# Patient Record
Sex: Female | Born: 1980 | Race: White
Health system: Southern US, Academic
[De-identification: ages and names within clinical notes are randomized; demographics above are authoritative.]

## PROBLEM LIST (undated history)

## (undated) ENCOUNTER — Encounter

## (undated) ENCOUNTER — Telehealth

## (undated) ENCOUNTER — Ambulatory Visit: Payer: MEDICAID | Attending: MOHS-Micrographic Surgery | Primary: MOHS-Micrographic Surgery

## (undated) ENCOUNTER — Ambulatory Visit

## (undated) DIAGNOSIS — J45909 Unspecified asthma, uncomplicated: Secondary | ICD-10-CM

## (undated) HISTORY — PX: LEEP: SHX91

## (undated) HISTORY — PX: APPENDECTOMY: SHX54

---

## 2004-12-22 ENCOUNTER — Emergency Department: Payer: Self-pay | Admitting: Emergency Medicine

## 2005-03-09 ENCOUNTER — Emergency Department: Payer: Self-pay | Admitting: Emergency Medicine

## 2005-03-14 ENCOUNTER — Emergency Department: Payer: Self-pay | Admitting: Internal Medicine

## 2005-04-28 ENCOUNTER — Encounter: Payer: Self-pay | Admitting: Anesthesiology

## 2005-05-15 ENCOUNTER — Encounter: Payer: Self-pay | Admitting: Anesthesiology

## 2005-07-22 ENCOUNTER — Emergency Department: Payer: Self-pay | Admitting: Emergency Medicine

## 2005-10-02 ENCOUNTER — Emergency Department: Payer: Self-pay | Admitting: Emergency Medicine

## 2006-04-01 IMAGING — CR DG KNEE COMPLETE 4+V*L*
1 series · 4 of 4 positions shown · non-contrast
Comparison: none

REASON FOR EXAM: Fall
COMMENTS:

[Series 1: view not recorded · 0.17mm/px · 4 of 4 slices shown]
[im 1/4]
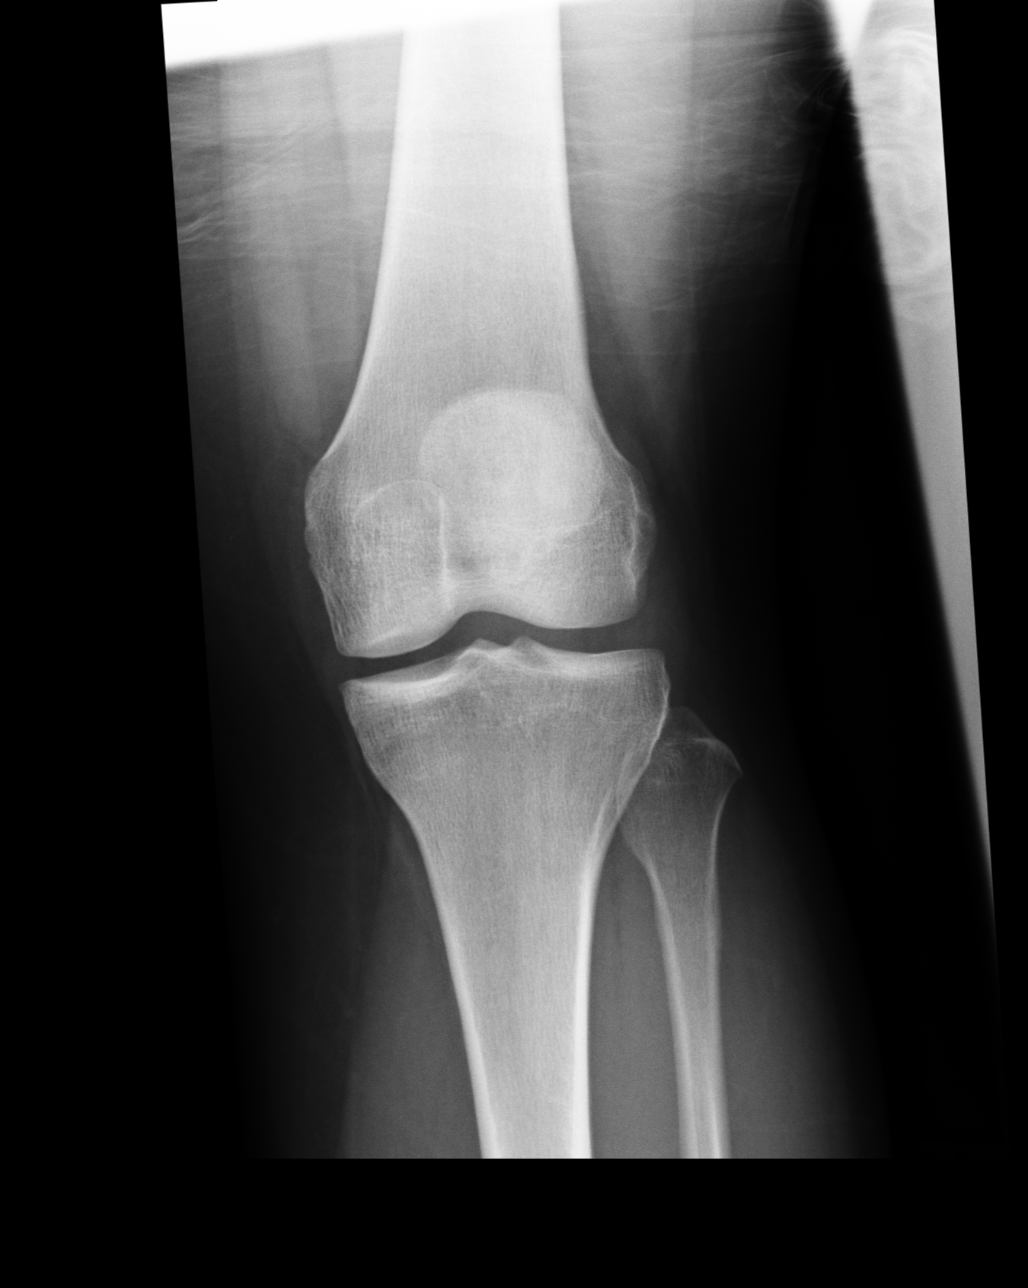
[im 2/4]
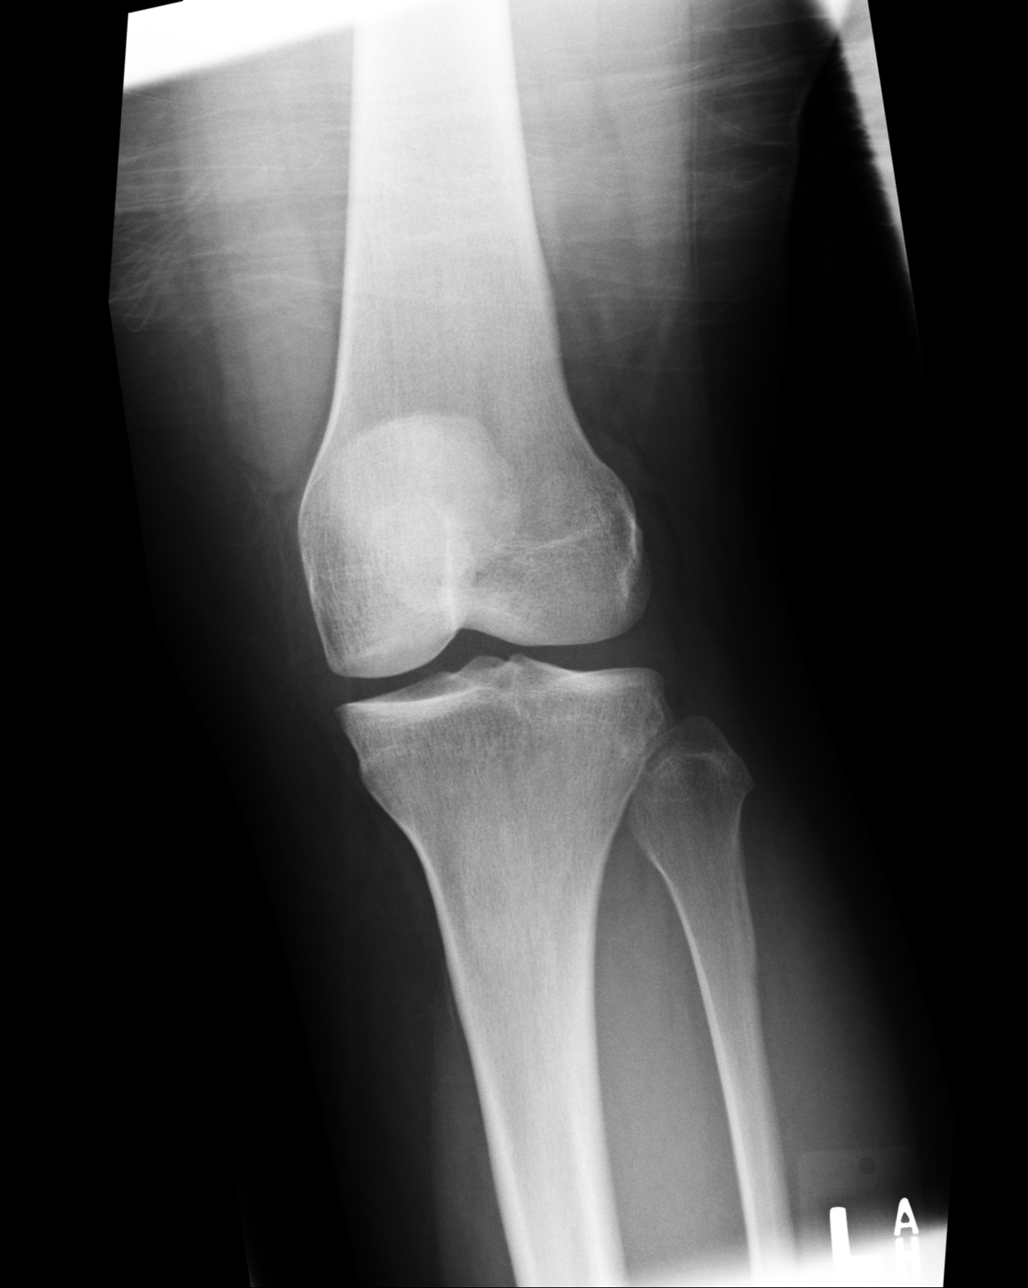
[im 3/4]
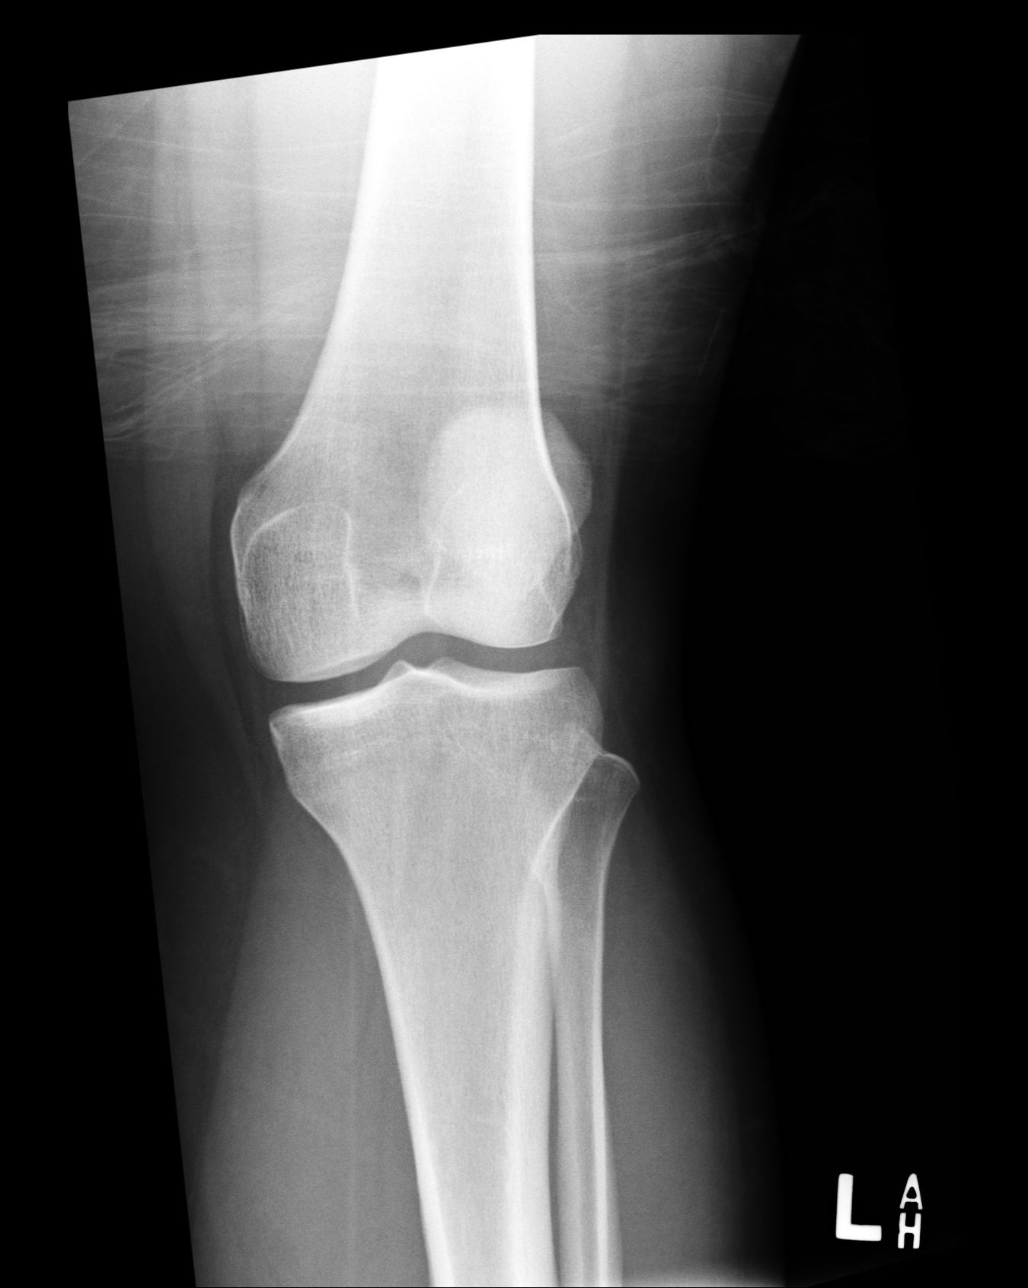
[im 4/4]
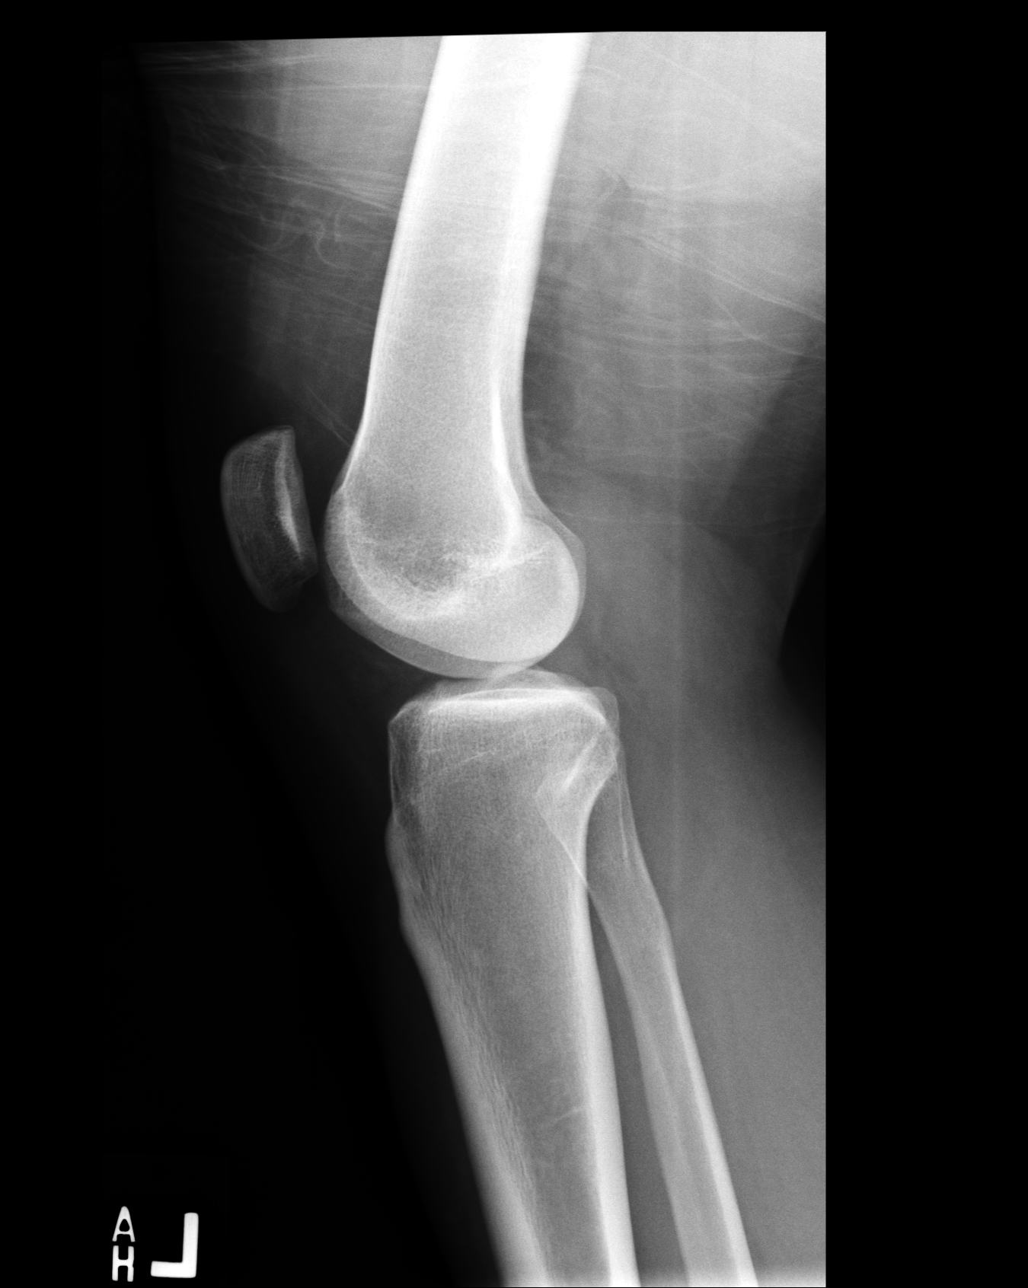

[4 of 4 positions shown; findings below may reference images not displayed]

PROCEDURE:     DXR - DXR KNEE LT COMP WITH OBLIQUES  - October 02, 2005 [DATE]

RESULT:           There does not appear to be evidence of fracture,
dislocation or malalignment.  If there is persistent clinical concern or
persistent complaints of pain, repeat evaluation in 7-10 days is recommended
if clinically warranted.
IMPRESSION: Unremarkable LEFT knee.

## 2006-04-17 ENCOUNTER — Emergency Department: Payer: Self-pay | Admitting: Unknown Physician Specialty

## 2006-07-29 ENCOUNTER — Ambulatory Visit: Payer: Self-pay | Admitting: Podiatry

## 2006-09-30 ENCOUNTER — Emergency Department: Payer: Self-pay

## 2006-12-14 ENCOUNTER — Emergency Department: Payer: Self-pay | Admitting: Emergency Medicine

## 2007-06-26 ENCOUNTER — Observation Stay: Payer: Self-pay

## 2007-07-26 ENCOUNTER — Ambulatory Visit: Payer: Self-pay | Admitting: Obstetrics & Gynecology

## 2007-07-27 ENCOUNTER — Inpatient Hospital Stay: Payer: Self-pay | Admitting: Obstetrics & Gynecology

## 2008-03-17 ENCOUNTER — Emergency Department: Payer: Self-pay | Admitting: Emergency Medicine

## 2008-03-19 ENCOUNTER — Emergency Department: Payer: Self-pay | Admitting: Unknown Physician Specialty

## 2008-04-18 ENCOUNTER — Ambulatory Visit: Payer: Self-pay | Admitting: Family Medicine

## 2008-08-02 ENCOUNTER — Emergency Department: Payer: Self-pay | Admitting: Emergency Medicine

## 2008-09-06 ENCOUNTER — Encounter
Admission: RE | Admit: 2008-09-06 | Discharge: 2008-09-06 | Payer: Self-pay | Admitting: Physical Medicine & Rehabilitation

## 2008-09-30 ENCOUNTER — Emergency Department: Payer: Self-pay | Admitting: Emergency Medicine

## 2008-11-03 ENCOUNTER — Inpatient Hospital Stay: Payer: Self-pay | Admitting: Internal Medicine

## 2008-11-18 ENCOUNTER — Inpatient Hospital Stay: Payer: Self-pay | Admitting: Internal Medicine
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
## 2011-09-09 ENCOUNTER — Emergency Department: Payer: Self-pay | Admitting: Emergency Medicine

## 2011-10-17 ENCOUNTER — Emergency Department: Payer: Self-pay | Admitting: Internal Medicine

## 2011-11-24 ENCOUNTER — Emergency Department: Payer: Self-pay | Admitting: Emergency Medicine

## 2012-04-05 ENCOUNTER — Emergency Department: Payer: Self-pay | Admitting: Emergency Medicine

## 2012-04-20 ENCOUNTER — Observation Stay: Payer: Self-pay | Admitting: Obstetrics & Gynecology

## 2012-04-20 LAB — URINALYSIS, COMPLETE
Bilirubin,UR: NEGATIVE
Ketone: NEGATIVE
Leukocyte Esterase: NEGATIVE
Nitrite: NEGATIVE
Ph: 6 (ref 4.5–8.0)
WBC UR: 2 /HPF (ref 0–5)

## 2012-04-20 LAB — FETAL FIBRONECTIN: Appearance: NORMAL

## 2012-07-20 ENCOUNTER — Ambulatory Visit: Payer: Self-pay | Admitting: Obstetrics and Gynecology

## 2012-07-20 LAB — CBC WITH DIFFERENTIAL/PLATELET
Basophil #: 0.1 10*3/uL (ref 0.0–0.1)
Basophil %: 0.5 %
Eosinophil %: 1 %
HCT: 36.6 % (ref 35.0–47.0)
HGB: 12.3 g/dL (ref 12.0–16.0)
Lymphocyte #: 2.8 10*3/uL (ref 1.0–3.6)
Lymphocyte %: 20.8 %
MCH: 29.9 pg (ref 26.0–34.0)
MCHC: 33.7 g/dL (ref 32.0–36.0)
MCV: 89 fL (ref 80–100)
Monocyte %: 5.6 %
Neutrophil #: 9.6 10*3/uL — ABNORMAL HIGH (ref 1.4–6.5)

## 2012-07-21 ENCOUNTER — Inpatient Hospital Stay: Payer: Self-pay

## 2012-07-21 LAB — DRUG SCREEN, URINE
Barbiturates, Ur Screen: NEGATIVE (ref ?–200)
Cocaine Metabolite,Ur ~~LOC~~: NEGATIVE (ref ?–300)
MDMA (Ecstasy)Ur Screen: NEGATIVE (ref ?–500)
Tricyclic, Ur Screen: NEGATIVE (ref ?–1000)

## 2012-07-22 LAB — HEMATOCRIT: HCT: 27.9 % — ABNORMAL LOW (ref 35.0–47.0)

## 2012-07-24 LAB — CBC WITH DIFFERENTIAL/PLATELET
Eosinophil #: 0.3 10*3/uL (ref 0.0–0.7)
Eosinophil %: 3.1 %
HCT: 30.6 % — ABNORMAL LOW (ref 35.0–47.0)
HGB: 10.4 g/dL — ABNORMAL LOW (ref 12.0–16.0)
Lymphocyte #: 2.2 10*3/uL (ref 1.0–3.6)
MCHC: 33.9 g/dL (ref 32.0–36.0)
MCV: 90 fL (ref 80–100)
Monocyte #: 0.7 x10 3/mm (ref 0.2–0.9)
Monocyte %: 6.3 %
Neutrophil #: 7.5 10*3/uL — ABNORMAL HIGH (ref 1.4–6.5)
Platelet: 292 10*3/uL (ref 150–440)
WBC: 10.8 10*3/uL (ref 3.6–11.0)

## 2012-07-24 LAB — BASIC METABOLIC PANEL
Calcium, Total: 8.7 mg/dL (ref 8.5–10.1)
Co2: 25 mmol/L (ref 21–32)
Creatinine: 0.82 mg/dL (ref 0.60–1.30)
EGFR (African American): >60
EGFR (Non-African Amer.): >60
Glucose: 111 mg/dL — ABNORMAL HIGH (ref 65–99)
Potassium: 3.6 mmol/L (ref 3.5–5.1)
Sodium: 138 mmol/L (ref 136–145)

## 2012-10-19 ENCOUNTER — Emergency Department: Payer: Self-pay | Admitting: Emergency Medicine

## 2012-11-19 ENCOUNTER — Ambulatory Visit: Payer: Self-pay | Admitting: Family Medicine

## 2013-02-09 ENCOUNTER — Ambulatory Visit: Payer: Self-pay | Admitting: Pain Medicine

## 2013-10-20 ENCOUNTER — Emergency Department: Payer: Self-pay | Admitting: Emergency Medicine

## 2013-10-24 LAB — WOUND CULTURE

## 2013-12-09 ENCOUNTER — Emergency Department: Payer: Self-pay | Admitting: Emergency Medicine

## 2013-12-09 LAB — URINALYSIS, COMPLETE
Bacteria: NONE SEEN
Ketone: NEGATIVE
Ph: 6 (ref 4.5–8.0)
RBC,UR: NONE SEEN /HPF (ref 0–5)
Specific Gravity: 1.004 (ref 1.003–1.030)
WBC UR: <1 /HPF (ref 0–5)

## 2013-12-09 LAB — CBC WITH DIFFERENTIAL/PLATELET
Basophil %: 0.8 %
Eosinophil #: 0.2 10*3/uL (ref 0.0–0.7)
HCT: 39 % (ref 35.0–47.0)
HGB: 12.7 g/dL (ref 12.0–16.0)
Lymphocyte #: 4.2 10*3/uL — ABNORMAL HIGH (ref 1.0–3.6)
MCH: 28.4 pg (ref 26.0–34.0)
MCHC: 32.6 g/dL (ref 32.0–36.0)
MCV: 87 fL (ref 80–100)
Monocyte %: 8 %
Neutrophil %: 49.7 %
Platelet: 315 10*3/uL (ref 150–440)
RDW: 15.1 % — ABNORMAL HIGH (ref 11.5–14.5)
WBC: 10.6 10*3/uL (ref 3.6–11.0)

## 2013-12-09 LAB — BASIC METABOLIC PANEL
Calcium, Total: 8.5 mg/dL (ref 8.5–10.1)
Chloride: 107 mmol/L (ref 98–107)
Co2: 25 mmol/L (ref 21–32)
EGFR (African American): >60
Glucose: 116 mg/dL — ABNORMAL HIGH (ref 65–99)
Potassium: 4.1 mmol/L (ref 3.5–5.1)
Sodium: 137 mmol/L (ref 136–145)

## 2014-04-18 ENCOUNTER — Emergency Department: Payer: Self-pay | Admitting: Emergency Medicine

## 2014-04-19 LAB — BASIC METABOLIC PANEL
ANION GAP: 5 — AB (ref 7–16)
BUN: 9 mg/dL (ref 7–18)
CALCIUM: 8.5 mg/dL (ref 8.5–10.1)
CHLORIDE: 108 mmol/L — AB (ref 98–107)
Co2: 25 mmol/L (ref 21–32)
Creatinine: 0.82 mg/dL (ref 0.60–1.30)
EGFR (African American): >60
GLUCOSE: 100 mg/dL — AB (ref 65–99)
Osmolality: 274 (ref 275–301)
Potassium: 3.6 mmol/L (ref 3.5–5.1)
Sodium: 138 mmol/L (ref 136–145)

## 2014-04-19 LAB — CBC
HCT: 39.2 % (ref 35.0–47.0)
HGB: 13.1 g/dL (ref 12.0–16.0)
MCH: 30.6 pg (ref 26.0–34.0)
MCHC: 33.5 g/dL (ref 32.0–36.0)
MCV: 91 fL (ref 80–100)
PLATELETS: 309 10*3/uL (ref 150–440)
RBC: 4.3 10*6/uL (ref 3.80–5.20)
RDW: 14.9 % — ABNORMAL HIGH (ref 11.5–14.5)
WBC: 10.8 10*3/uL (ref 3.6–11.0)

## 2014-04-19 LAB — SEDIMENTATION RATE: Erythrocyte Sed Rate: 5 mm/hr (ref 0–20)

## 2014-11-22 ENCOUNTER — Emergency Department: Payer: Self-pay | Admitting: Emergency Medicine

## 2015-04-03 NOTE — Consult Note (Signed)
    Maternal Age 34    Gravida 5    Para 2    Term Deliveries 2    Preterm Deliveries 0    Abortions 2    Living Children 0    Final EDD (dd-[id number]) 29-Jul-2012    Blood Type (Maternal) A negative    Antibody Screen Results (Maternal) negative    HIV Results (Maternal) negative    Gonorrhea Results (Maternal) negative    Chlamydia Results (Maternal) negative    Hepatitis C Culture (Maternal) negative    VDRL/RPR/Syphilis Results (Maternal) negative    Varicella Titer Results (Maternal) Positive    Rubella Results (Maternal) immune    Hepatitis B Surface Antigen Results (Maternal) negative    Group B Strep Results Maternal (Result >5wks must be treated as unknown) positive     Additional Comments I was asked by Dr. Jean RosenthalJackson and Dr Janene HarveyKlett to speak about neonatal abstinense syndrome with Lelan Ponsevon Walberg a 34y/o mother at 3739 weeks who has been on chronic opiates for foot pain.  I have spoken with the medical team and reviewed the chart.  She is scheduled for repeat c-section in AM, and denies any complications with her pregnancy.  Ranae states that she has been taking 1-3 percocet ~5x/week for foot pain.  She also stated that since she stopped Klonipin, she has had to use cannibus for concerns with her nerves.  I discussed the importance of pain control for both mother and child, but also discussed the risk of opiate exposure both short and long term to her child.  I stated that long term opiates may effect learning and behavior.  In the short term, infants born exposed to opiates can go through life threatening withdrawal.  Due to this concern, her sone will have to [facility], and if pharmacologic interventions are needed, [facility] may been to [facility] several weeks until he can can be wenaed off of medication.  Mom express sadness, but understanding.  As she is scheduled for a c-section tomorrow, I informed her that I will be there for her  delivery, as is routine for c-sections.  I reviewed visitation policy, and answered her questions.  Thank you for allowing us to participate in her can, and please let us know if we can be of any additional service.  I spent 45 minutes on this consultation with 30 minutes face-to-face time.    Parental Contact Parents informed at length regarding prenatal care and plan   Electronic Signatures: Torrie MayersHorowitz, Ieshia Hatcher N (MD)  (Signed 06-Aug-13 15:38)  Authored: PREGNANCY and LABOR, ADDITIONAL COMMENTS   Last Updated: 06-Aug-13 15:38 by Torrie MayersHorowitz, Yadira Hada N (MD)

## 2015-04-03 NOTE — Op Note (Signed)
PATIENT NAME:  Cassandra Cortez, Cassandra Cortez MR#:  [id number] DATE OF BIRTH:  [birth year]  DATE OF PROCEDURE:  07/21/2012  PREOPERATIVE DIAGNOSES:  1. Intrauterine pregnancy at [redacted] weeks gestation.  2. History of previous cesarean section, desires repeat.  3. Multiparity, desires permanent sterilization.   POSTOPERATIVE DIAGNOSES:  1. Intrauterine pregnancy at [redacted] weeks gestation.  2. History of previous cesarean section, desires repeat.  3. Multiparity, desires permanent sterilization.   PROCEDURES PERFORMED: 1. Low transverse cesarean section via Pfannenstiel incision.  2. Bilateral tubal ligation.   SURGEON: Thomasene MohairStephen Jackson, M.D.   ANESTHESIA: Spinal.  ESTIMATED BLOOD LOSS: 1000 mL.  OPERATIVE FLUIDS: 2700 mL crystalloid.   COMPLICATIONS: None.   FINDINGS: Gravid uterus with normal appearing fallopian tubes and ovaries.   SPECIMENS: Portion of right and left tubes to pathology for permanent.   CONDITION AT END OF PROCEDURE: Stable.  INDICATIONS FOR PROCEDURE: Ms. Laveda AbbeCrouse is Cortez 34 year old gravida 5, para 2-0-2-2 at 7839 weeks gestational age who has Cortez history of prior cesarean section. She desires repeat cesarean section and desires tubal ligation. The patient had signed the sterilization consent form at the proper time. Prior to the surgery, her desire to have Cortez tubal ligation was confirmed with the knowledge that there is Cortez risk of failure of approximately 4 in 1000 and if she does become pregnant after having Cortez tubal ligation her risk of ectopic is increased. With this knowledge in mind, she still agreed to continue with the surgery and have Cortez tubal ligation.   PROCEDURE IN DETAIL: The patient was taken to the Operating Room where spinal anesthesia was administered and was found to be adequate. She was placed in the dorsal supine position with Cortez leftward tilt. Cortez Pfannenstiel incision was made and carried through the various layers until the peritoneum was identified and entered sharply. The  peritoneum was extended in the cranial and caudal directions. Cortez bladder flap was created and Cortez bladder retractor placed to pull the bladder out of the operative area of interest. Cortez low transverse hysterotomy was made and extended laterally with cranial and caudal tension. The fetal vertex was grasped and elevated to the hysterotomy and with fundal pressure the head followed by shoulders and the rest of the body were delivered without difficulty. There was one nuchal cord noted at the time of delivery, which was delivered through. The cord was clamped and cut and the infant was handed to the awaiting pediatrician. The placenta was removed from the uterus and the uterus was exteriorized and cleared of all clots and debris. The hysterotomy was closed using Cortez #0 Vicryl in Cortez running locked fashion. Several figure-of-eight stitches were needed to obtain hemostasis along the hysterotomy.   Attention was turned to the left fallopian tube where the mid isthmic portion was grasped with Babcock clamps and suture ligated for approximately Cortez 3 cm segment using plain gut suture and Cortez segment of ligated tube was removed. The same procedure was carried out on the right side with no difficulty. Hemostasis was guaranteed on both sides.   The uterus was returned to the abdomen. The gutters were cleared of all clots and debris. The fascia was reapproximated using PDS, using two different stitches, tied in the middle. The subcutaneous adipose tissue was closed using Cortez 3-0 on Cortez SH needle. The skin was reapproximated using staples.   At the end of the procedure, Cortez KCI wound VAC was placed across the incision according to the manufacturer's instructions.   The patient tolerated  the procedure well. Sponge, lap, and needle counts were correct x2. For antibiotic prophylaxis, the patient received 3 grams of Ancef prior to skin incision. For VTE prophylaxis, the patient was wearing pneumatic compression stockings throughout the entire  procedure. She was transferred to the recovery area in stable condition.  ____________________________ Conard Novak, MD sdj:slb D: 07/21/2012 09:38:22 ET T: 07/21/2012 11:01:32 ET JOB#: [id number]  cc: Conard Novak, MD, <Dictator> Conard Novak MD ELECTRONICALLY SIGNED 07/22/2012 9:21

## 2015-04-07 NOTE — Consult Note (Signed)
PATIENT NAME:  Cassandra Cortez, Cassandra Cortez MR#:  [id number] DATE OF BIRTH:  1981-03-18  DATE OF CONSULTATION:  04/19/2014  REFERRING PHYSICIAN:  Dr. Chiquita Loth CONSULTING PHYSICIAN:  Susa Griffins, MD  PRIMARY CARE PHYSICIAN: Dr. Temple Pacini  REASON FOR CONSULT: Left-sided facial pain.   HISTORY OF PRESENT ILLNESS:  The patient is a 34 year old morbidly obese female with a BMI of 40 who comes to the Emergency Department with complaints of left-sided facial pain. The patient states the pain started since yesterday, sharp in nature, intermittent lasts for a few seconds to 1 hour . Nothing relieves the pain. No radiation. The patient states this is an electric shock-like pain. Denies having any recent cold or cough. Denies having any earache. Denies having any decreased hearing. Denies having any drainage from the nose, ears, or throat. Denies having any trauma.  Denies having any fever. Came to the emergency department. The patient received multiple doses of pain medication and continues to state that the patient is having the pain.  As I entered the room, patient was sleeping, was able to sit down and able to speak comfortably. During the conversation, the patient stated that developed another attack; however, patient was touching her face. The patient denied having any change in her vision.   PAST MEDICAL HISTORY: Bone spurs in the foot. Asthma.   PAST SURGICAL HISTORY:  1. A LEEP procedure.  2. Ear tubes x 8 to the right ear. 3. Appendectomy.  4. C-section.   ALLERGIES: ASPIRIN, PENICILLIN, AND SULFA.   HOME MEDICATIONS:  1. Ventolin 1 puff once a day.  2. Pulmicort 1 puff once a day.  3. Neurontin 300 mg once a day.   SOCIAL HISTORY:  Denies smoking, drinking alcohol or using illicit drugs.  Lives with her parents. Does not work.   FAMILY HISTORY: Hypertension.   REVIEW OF SYSTEMS: CONSTITUTIONAL:  Denies having any fever.  EYES: No change in vision.  EARS, NOSE, AND THROAT: No change in  hearing.  RESPIRATORY: No cough, shortness of breath.  CARDIOVASCULAR: No chest pain, palpations. GASTROINTESTINAL: No nausea, vomiting, abdominal pain.  GENITOURINARY: No dysuria or hematochezia.   HEMATOLOGY:  No easy bruising or bleeding.  SKIN: No rash or lesions.  MUSCULOSKELETAL: No joint pains and aches . NEUROLOGICAL:  No weakness or numbness in any part of the body.   PHYSICAL EXAMINATION:  GENERAL: This is a well built, well nourished, morbidly obese female sitting in the bed, not in distress.  VITAL SIGNS: Temperature 97.7, pulse 80, blood pressure 118/82, respiratory rate of 16, oxygen saturation is 97% on room air.  HEENT: Head normocephalic, atraumatic. There is no scleral icterus. Conjunctivae normal. Pupils equal and react to light. Extraocular movements are intact. Mucous membranes moist. No pharyngeal erythema. On examination of the left ear, the patient did not have any tenderness. Moving the external ear.  External ear canal . Tympanic membranes: No bulging, no effusion. Right ear shows chronic post dilation.  NECK: Supple. No lymphadenopathy. No JVD. No carotid bruits.   CHEST: Has no focal tenderness.  Lungs bilaterally clear to auscultation. HEART: S1, S2, regular. No murmurs are heard.  ABDOMEN: Bowel sounds present. Soft, nontender, nondistended. No hepatosplenomegaly.  EXTREMITIES: No pedal edema. Pulses 2+. NEUROLOGICAL:  Patient is alert, but oriented to place, person, and time. Cranial nerves II through XII intact. Motor 5/5 in upper and lower extremities.  SKIN: No rash or lesions.  MUSCULOSKELETAL: No joint pains and aches.   LABORATORY DATA: CBC  is completely within normal limits. A CMP is completely within normal limits. Sedimentation rate of 5.   RADIOLOGICAL DATA:  A CT head without contrast: No acute intracranial abnormality.   ASSESSMENT AND PLAN: This patient is a 34 year old female who comes to the Emergency Department with complaints of left-sided  facial pain.  Differential diagnosis, possible acute viral neuritis versus the possibility of patient developing shingles.  However, the patient does not have any symptoms of pain eliciting through touch, which seems to be very unlikely that this is a trigeminal neuralgia.  Also, the other possibility of left ear infection.  At present, patient does not have any bulging of the tympanic membranes. No redness.  Patient does not have any fever. This pain of acute onset of one day's duration. The patient not being in distress .  Recommended patient being on nonsteroidal anti-inflammatory drug 3 times a day for 3 days while continuing her Neurontin. Discussed with Dr. Dolores FrameSung. Agreeable with this approach.    ____________________________ Susa GriffinsPadmaja Mylan Schwarz, MD pv:dd/am D: 04/19/2014 04:10:35 ET T: 04/19/2014 04:34:55 ET JOB#: [id number]  cc: Susa GriffinsPadmaja Curley Fayette, MD, <Dictator> Clerance LavPADMAJA Jasiah Elsen MD ELECTRONICALLY SIGNED 04/26/2014 21:01

## 2015-04-24 NOTE — H&P (Signed)
L&D Evaluation:  History Expanded:   HPI 34 yo [id number] WF at 25 5/7 weeks w Intermittant, Severe at times, Lower Abd Pain, No Radiation, No assoc symptoms, No modifiers, No context.  Normal BMs.  No recent UTI.  No n/v/f/c.  DFM today but ususally fetus moves well.  No vaginal bleeding, ROM, Ctxs. Prenatal Care at [facility]. Plans Cesarean Section and Bilateral Tubal Ligation at term.    Gravida 5    Term 2    PreTerm 0    Abortion 2    Living 2    Patient's Medical History Asthma  Anxiety    Patient's Surgical History Appendectomy  LEEP  Previous C-Section  ([facility], [facility]), Bunionectomy, Ear tubes.    Medications Pre Natal Vitamins    Allergies PCN, Sulfa    Social History tobacco  drugs  Narcotic and benzodiazepams abuse    Family History Non-Contributory   ROS:   General normal    HEENT normal    CNS normal    GI normal    GU normal    Resp normal    CV normal    Renal normal    MS normal   Exam:   Vital Signs stable    General no apparent distress    Mental Status clear    Chest clear    Heart normal sinus rhythm    Abdomen gravid, non-tender, Scar well healed.  Tenderness in lower abd but non-specific or localized.    Estimated Fetal Weight Average for gestational age    Back no CVAT    Edema no edema    Pelvic no external lesions, cervix closed and thick    Mebranes Intact    FHT normal rate with no decels    Ucx absent    Skin dry   Plan:   Plan Abd pain.  DFM today.  Prior Cesarean Section x2, possible Adhesions.    Comments Pain may be from adhesions. Dx of exclusion. Will check for Preterm Labor... fetal fibronectin and exam (neg). Will check for UTI/Pyelo... unlikely, UA pending. Will monitor bowel functioning... recent BM normal. No signs of pancreatitis, gastroenteritis, diverticulitis, etc. [facility] exam findings so far.  May need reassurance and rest.   Electronic Signatures: Letitia LibraHarris, Kaylum Shrum Paul (MD)   (Signed 07-May-13 15:58)  Authored: L&D Evaluation   Last Updated: 07-May-13 15:58 by Letitia LibraHarris, Percy Comp Paul (MD)

## 2015-06-07 ENCOUNTER — Emergency Department: Payer: Medicaid Other

## 2015-06-07 ENCOUNTER — Emergency Department
Admission: EM | Admit: 2015-06-07 | Discharge: 2015-06-08 | Disposition: A | Discharge status: Home / Self Care | Payer: Medicaid Other | Attending: Emergency Medicine | Admitting: Emergency Medicine

## 2015-06-07 ENCOUNTER — Encounter: Payer: Self-pay | Admitting: Emergency Medicine

## 2015-06-07 DIAGNOSIS — Z880 Allergy status to penicillin: Secondary | ICD-10-CM | POA: Insufficient documentation

## 2015-06-07 DIAGNOSIS — Z3202 Encounter for pregnancy test, result negative: Secondary | ICD-10-CM | POA: Insufficient documentation

## 2015-06-07 DIAGNOSIS — N939 Abnormal uterine and vaginal bleeding, unspecified: Secondary | ICD-10-CM | POA: Diagnosis present

## 2015-06-07 DIAGNOSIS — N938 Other specified abnormal uterine and vaginal bleeding: Secondary | ICD-10-CM

## 2015-06-07 DIAGNOSIS — R102 Pelvic and perineal pain: Secondary | ICD-10-CM

## 2015-06-07 LAB — URINALYSIS COMPLETE WITH MICROSCOPIC (ARMC ONLY)
BACTERIA UA: NONE SEEN
BILIRUBIN URINE: NEGATIVE
GLUCOSE, UA: NEGATIVE mg/dL
Ketones, ur: NEGATIVE mg/dL
Nitrite: NEGATIVE
Protein, ur: 100 mg/dL — AB
Specific Gravity, Urine: 1.023 (ref 1.005–1.030)
pH: 5 (ref 5.0–8.0)

## 2015-06-07 LAB — POCT PREGNANCY, URINE: Preg Test, Ur: NEGATIVE

## 2015-06-07 LAB — COMPREHENSIVE METABOLIC PANEL
ALBUMIN: 4.1 g/dL (ref 3.5–5.0)
ALT: 26 U/L (ref 14–54)
AST: 25 U/L (ref 15–41)
Alkaline Phosphatase: 32 U/L — ABNORMAL LOW (ref 38–126)
Anion gap: 6 (ref 5–15)
BUN: 8 mg/dL (ref 6–20)
CHLORIDE: 102 mmol/L (ref 101–111)
CO2: 26 mmol/L (ref 22–32)
CREATININE: 0.74 mg/dL (ref 0.44–1.00)
Calcium: 8.6 mg/dL — ABNORMAL LOW (ref 8.9–10.3)
GFR calc Af Amer: >60 mL/min (ref >60)
GFR calc non Af Amer: >60 mL/min (ref >60)
Glucose, Bld: 103 mg/dL — ABNORMAL HIGH (ref 65–99)
Potassium: 4 mmol/L (ref 3.5–5.1)
Sodium: 134 mmol/L — ABNORMAL LOW (ref 135–145)
Total Bilirubin: 0.2 mg/dL — ABNORMAL LOW (ref 0.3–1.2)
Total Protein: 8.1 g/dL (ref 6.5–8.1)

## 2015-06-07 LAB — CBC WITH DIFFERENTIAL/PLATELET
Basophils Absolute: 0.1 10*3/uL (ref 0–0.1)
Basophils Relative: 1 %
Eosinophils Absolute: 0.2 10*3/uL (ref 0–0.7)
Eosinophils Relative: 2 %
HCT: 42.1 % (ref 35.0–47.0)
HEMOGLOBIN: 13.8 g/dL (ref 12.0–16.0)
LYMPHS ABS: 3 10*3/uL (ref 1.0–3.6)
Lymphocytes Relative: 30 %
MCH: 28.8 pg (ref 26.0–34.0)
MCHC: 32.9 g/dL (ref 32.0–36.0)
MCV: 87.5 fL (ref 80.0–100.0)
Monocytes Absolute: 0.6 10*3/uL (ref 0.2–0.9)
Monocytes Relative: 6 %
NEUTROS ABS: 6 10*3/uL (ref 1.4–6.5)
NEUTROS PCT: 61 %
Platelets: 366 10*3/uL (ref 150–440)
RBC: 4.81 MIL/uL (ref 3.80–5.20)
RDW: 16.5 % — ABNORMAL HIGH (ref 11.5–14.5)
WBC: 9.9 10*3/uL (ref 3.6–11.0)

## 2015-06-07 LAB — WET PREP, GENITAL
TRICH WET PREP: NONE SEEN
YEAST WET PREP: NONE SEEN

## 2015-06-07 LAB — CBC
HEMATOCRIT: 39.6 % (ref 35.0–47.0)
HEMOGLOBIN: 12.8 g/dL (ref 12.0–16.0)
MCH: 28.3 pg (ref 26.0–34.0)
MCHC: 32.2 g/dL (ref 32.0–36.0)
MCV: 87.9 fL (ref 80.0–100.0)
Platelets: 333 10*3/uL (ref 150–440)
RBC: 4.51 MIL/uL (ref 3.80–5.20)
RDW: 16.6 % — ABNORMAL HIGH (ref 11.5–14.5)
WBC: 19.8 10*3/uL — ABNORMAL HIGH (ref 3.6–11.0)

## 2015-06-07 MED ORDER — OXYCODONE-ACETAMINOPHEN 5-325 MG PO TABS
ORAL_TABLET | ORAL | Status: AC
  Administered 2015-06-07: 1 via ORAL
  Filled 2015-06-07: qty 1

## 2015-06-07 MED ORDER — HYDROMORPHONE HCL 1 MG/ML IJ SOLN
INTRAMUSCULAR | Status: AC
  Administered 2015-06-07: 1 mg via INTRAVENOUS
  Filled 2015-06-07: qty 1

## 2015-06-07 MED ORDER — MEDROXYPROGESTERONE ACETATE 10 MG PO TABS: 20.0000 mg | ORAL_TABLET | Freq: Three times a day (TID) | ORAL | Status: DC

## 2015-06-07 MED ORDER — SODIUM CHLORIDE 0.9 % IV BOLUS (SEPSIS)
1000.0000 mL | Freq: Once | INTRAVENOUS | Status: AC
  Administered 2015-06-07: 1000 mL via INTRAVENOUS

## 2015-06-07 MED ORDER — HYDROMORPHONE HCL 1 MG/ML IJ SOLN
1.0000 mg | Freq: Once | INTRAMUSCULAR | Status: AC
  Administered 2015-06-07: 1 mg via INTRAMUSCULAR

## 2015-06-07 MED ORDER — PROMETHAZINE HCL 12.5 MG PO TABS: 12.5000 mg | ORAL_TABLET | Freq: Four times a day (QID) | ORAL | Status: DC | PRN

## 2015-06-07 MED ORDER — CEFTRIAXONE SODIUM IN DEXTROSE 20 MG/ML IV SOLN
1.0000 g | Freq: Once | INTRAVENOUS | Status: AC
  Administered 2015-06-07: 1 g via INTRAVENOUS
  Filled 2015-06-07: qty 50

## 2015-06-07 MED ORDER — SODIUM CHLORIDE 0.9 % IV SOLN
8.0000 mg | Freq: Once | INTRAVENOUS | Status: AC
  Administered 2015-06-07: 8 mg via INTRAVENOUS
  Filled 2015-06-07 (×2): qty 4

## 2015-06-07 MED ORDER — HYDROMORPHONE HCL 1 MG/ML IJ SOLN: 1.0000 mg | Freq: Once | INTRAMUSCULAR | Status: AC

## 2015-06-07 MED ORDER — MEDROXYPROGESTERONE ACETATE 10 MG PO TABS
20.0000 mg | ORAL_TABLET | Freq: Every day | ORAL | Status: DC
  Administered 2015-06-07: 20 mg via ORAL
  Filled 2015-06-07: qty 2

## 2015-06-07 MED ORDER — METOCLOPRAMIDE HCL 5 MG/ML IJ SOLN
10.0000 mg | Freq: Once | INTRAMUSCULAR | Status: AC
  Administered 2015-06-07: 10 mg via INTRAVENOUS

## 2015-06-07 MED ORDER — CEFTRIAXONE SODIUM IN DEXTROSE 20 MG/ML IV SOLN
INTRAVENOUS | Status: AC
  Administered 2015-06-07: 1 g via INTRAVENOUS
  Filled 2015-06-07: qty 50

## 2015-06-07 MED ORDER — HYDROMORPHONE HCL 1 MG/ML IJ SOLN
INTRAMUSCULAR | Status: AC
  Filled 2015-06-07: qty 1

## 2015-06-07 MED ORDER — OXYCODONE-ACETAMINOPHEN 7.5-325 MG PO TABS: 1.0000 | ORAL_TABLET | ORAL | Status: DC | PRN

## 2015-06-07 MED ORDER — OXYCODONE-ACETAMINOPHEN 5-325 MG PO TABS
1.0000 | ORAL_TABLET | Freq: Once | ORAL | Status: AC
  Administered 2015-06-07: 1 via ORAL

## 2015-06-07 MED ORDER — DOXYCYCLINE HYCLATE 50 MG PO CAPS: 100.0000 mg | ORAL_CAPSULE | Freq: Two times a day (BID) | ORAL | Status: DC

## 2015-06-07 MED ORDER — DOXYCYCLINE HYCLATE 100 MG PO TABS
100.0000 mg | ORAL_TABLET | Freq: Once | ORAL | Status: AC
  Administered 2015-06-07: 100 mg via ORAL
  Filled 2015-06-07: qty 1

## 2015-06-07 MED ORDER — HYDROMORPHONE HCL 1 MG/ML IJ SOLN
1.0000 mg | Freq: Once | INTRAMUSCULAR | Status: AC
Start: 2015-06-07 — End: 2015-06-07
  Administered 2015-06-07: 1 mg via INTRAVENOUS

## 2015-06-07 MED ORDER — METOCLOPRAMIDE HCL 5 MG/ML IJ SOLN
INTRAMUSCULAR | Status: AC
  Administered 2015-06-07: 10 mg via INTRAVENOUS
  Filled 2015-06-07: qty 2

## 2015-06-07 NOTE — ED Notes (Signed)
This RN was present during the pelvic exam performed by Dr. Carollee Massed. Patient tolerated the pelvic exam well.

## 2015-06-07 NOTE — ED Notes (Addendum)
Pt reports "labor like" cramping x 2 days. Pt began period yesterday. Pt reports increased amount of blood and increase in clotting. Pt reports having passed several "large" clots this morning. Pt reports mild nausea at this time. Pt also reports foul smelling discharge from [employer] area. White residue removed from pts naval. No odor upon removal.

## 2015-06-07 NOTE — ED Provider Notes (Signed)
[facility] Emergency Department Provider Note  ____________________________________________  Time seen: 1510  I have reviewed the triage vital signs and the nursing notes.   HISTORY  Chief Complaint Vaginal Bleeding and Abdominal Pain     HPI Cassandra Cortez is a 34 y.o. female who presents with pelvic pain and heavy vaginal bleeding. She says both the bleeding and the pain has been quite severe the past 2 days. She has a history of rather heavy menstrual flow that is prolonged. She says her cycles are consistent potential bleeding for 7-9 days each time. Currently she has been bleeding for 2-3 days. The flow has been quite heavy the past 2 days with a reported use of 30 tampons. She reports that the general bleeding has been heavier since she had her last child 3 years ago.  In addition, the patient reports she has some discomfort and foul discharge from her umbilicus. This is been occurring for a couple of weeks.     No past medical history on file.  There are no active problems to display for this patient.   Past Surgical History  Procedure Laterality Date  . Cesarean section    . Leep    . Appendectomy      Current Outpatient Rx  Name  Route  Sig  Dispense  Refill  . doxycycline (VIBRAMYCIN) 50 MG capsule   Oral   Take 2 capsules (100 mg total) by mouth 2 (two) times daily.   28 capsule   0   . medroxyPROGESTERone (PROVERA) 10 MG tablet   Oral   Take 2 tablets (20 mg total) by mouth 3 (three) times daily.   40 tablet   0   . oxyCODONE-acetaminophen (PERCOCET) 7.5-325 MG per tablet   Oral   Take 1 tablet by mouth every 4 (four) hours as needed for severe pain.   12 tablet   0   . promethazine (PHENERGAN) 12.5 MG tablet   Oral   Take 1 tablet (12.5 mg total) by mouth every 6 (six) hours as needed for nausea or vomiting.   12 tablet   0     Allergies Penicillins and Sulfa antibiotics  No family history on file.  Social  History History  Substance Use Topics  . Smoking status: Never Smoker   . Smokeless tobacco: Not on file  . Alcohol Use: Yes    Review of Systems  Constitutional: Negative for fever. ENT: Negative for sore throat. Cardiovascular: Negative for chest pain. Respiratory: Negative for shortness of breath. Gastrointestinal: Negative for abdominal pain, vomiting and diarrhea. Genitourinary: Pelvic pain. Heavy vaginal bleeding. See history of present illness Musculoskeletal: No myalgias or injuries. Skin: Discharge from umbilicus. See history of present illness Neurological: Negative for headaches   10-point ROS otherwise negative.  ____________________________________________   PHYSICAL EXAM:  VITAL SIGNS: ED Triage Vitals  Enc Vitals Group     BP 06/07/15 1355 134/97 mmHg     Pulse Rate 06/07/15 1355 116     Resp 06/07/15 1355 18     Temp 06/07/15 1355 99.7 F (37.6 C)     Temp Source 06/07/15 1355 Oral     SpO2 06/07/15 1355 97 %     Weight 06/07/15 1355 219 lb (99.338 kg)     Height 06/07/15 1355  (1.575 m)     Head Cir --      Peak Flow --      Pain Score 06/07/15 1356 7  Pain Loc --      Pain Edu? --      Excl. in GC? --     Constitutional: Alert and oriented. Patient with moderate to more severe distress which fluctuates. ENT   Head: Normocephalic and atraumatic.   Nose: No congestion/rhinnorhea.   Mouth/Throat: Mucous membranes are moist. Cardiovascular: Normal rate, regular rhythm, no murmur noted Respiratory:  Normal respiratory effort, no tachypnea.    Breath sounds are clear and equal bilaterally.  Gastrointestinal: Soft, mild discomfort in her lower abdomen, pelvic area. Normal bowel sounds.  Back: No muscle spasm, no tenderness, no CVA tenderness. Musculoskeletal: No deformity noted. Nontender with normal range of motion in all extremities.  No noted edema. Neurologic:  Normal speech and language. No gross focal neurologic deficits are  appreciated.  Skin:  Skin is warm, dry. No rash noted. Psychiatric: Mood and affect are normal. Speech and behavior are normal.  ____________________________________________    LABS (pertinent positives/negatives)  CBC is within normal limits with a hemoglobin of 13.8. White blood cell count 9.9 Metabolic panel within normal limits except for minimal decrease in sodium at 134 and minimal elevation of glucose at 103. Urinalysis shows both red blood cells and white blood cells to numerous to count. Urine pregnancy: Negative ____________________________________________ ____________________________________________    RADIOLOGY  Pelvic ultrasound: IMPRESSION: Left ovary is not visualized. No other definite abnormality seen in the pelvis.  ____________________________________________  ____________________________________________   INITIAL IMPRESSION / ASSESSMENT AND PLAN / ED COURSE  Pertinent labs & imaging results that were available during my care of the patient were reviewed by me and considered in my medical decision making (see chart for details).  Patient with pelvic pain cramping and heavy vaginal bleeding. We have obtain an ultrasound which is unremarkable. No noted uterine fibroids. Her hemoglobin level is good.   ----------------------------------------- 5:57 PM on 06/07/2015 -----------------------------------------  Pelvic ultrasound did not see any acute pathology. The left ovary was not visualized. No fibroids are noted.  On reexam, the patient is nauseous and vomiting. She has approximately 250-300 mL's of emesis and a emesis bag. She continues to have pain. We will treat her further for her pain and nausea.  ----------------------------------------- 6:56 PM on 06/07/2015 -----------------------------------------  Upon recheck, I found that the the patient had not received the pain medication and Zofran that I had ordered for her. This is being obtained now.    ----------------------------------------- 7:46 PM on 06/07/2015 -----------------------------------------  Patient received Zofran but there seemed to be a miscommunication about the pain medication. The patient has now received the pain medication. I'm quite to order a repeat CBC. Given the fact that she has been here this line to be reasonable to make sure that the hemoglobin has not dropped with active vaginal bleeding that she's had.  ----------------------------------------- 10:49 PM on 06/07/2015 -----------------------------------------  I've called the lab twice regarding the CBC that I ordered at approximately 8:00. This is still pending. I have ordered a dose of ceftriaxone for the urinary tract infection that was noted with white blood cells too numerous to count.  CBC has just returned with a notable jump in her white blood cell to 19,000 and a drop in hemoglobin at 12.8.   Pelvic exam finds a fairly normal looking vaginal vault. She had a tampon in place that I removed at the beginning of the pelvic exam. The tampon did not have much blood on it was no blood in the vault. There is just a scant amount around the  cervix. She does have notable cervical motion tenderness.  I've called and spoke with Dr. Jean Rosenthal, OB/GYN, about the patient's situation and results.  He is supportive of her discharge home. She can follow-up in the clinic tomorrow. We will treat her with doxycycline, Provera, and Percocet currently. I discussed this with the patient and she is appreciative of the care and agrees with the plan.  ____________________________________________   FINAL CLINICAL IMPRESSION(S) / ED DIAGNOSES  Final diagnoses:  Female pelvic pain  Dysfunctional uterine bleeding      Darien Ramus, MD 06/07/15 2338

## 2015-06-07 NOTE — Discharge Instructions (Signed)
Take doxycycline for possible infection. The results will come back in a day or 2. Take Provera to help stop vaginal bleeding. Call [facility] for reevaluation tomorrow. Return to the emergency department if you have uncontrolled pain, heavy bleeding requiring more then 4 tampons in 2 hours, or if you have other urgent concerns.  Pelvic Pain Pelvic pain is pain felt below the belly button and between your hips. It can be caused by many different things. It is important to get help right away. This is especially true for severe, sharp, or unusual pain that comes on suddenly.  HOME CARE  Only take medicine as told by your doctor.  Rest as told by your doctor.  Eat a healthy diet, such as fruits, vegetables, and lean meats.  Drink enough fluids to keep your pee (urine) clear or pale yellow, or as told.  Avoid sex (intercourse) if it causes pain.  Apply warm or cold packs to your lower belly (abdomen). Use the type of pack that helps the pain.  Avoid situations that cause you stress.  Keep a journal to track your pain. Write down:  When the pain started.  Where it is located.  If there are things that seem to be related to the pain, such as food or your period.  Follow up with your doctor as told. GET HELP RIGHT AWAY IF:   You have heavy bleeding from the vagina.  You have more pelvic pain.  You feel lightheaded or pass out (faint).  You have chills.  You have pain when you pee or have blood in your pee.  You cannot stop having watery poop (diarrhea).  You cannot stop throwing up (vomiting).  You have a fever or lasting symptoms for more than 3 days.  You have a fever and your symptoms suddenly get worse.  You are being physically or sexually abused.  Your medicine does not help your pain.  You have fluid (discharge) coming from your vagina that is not normal. MAKE SURE YOU:  Understand these instructions.  Will watch your condition.  Will get help if you  are not doing well or get worse. Document Released: 05/19/2008 Document Revised: 06/01/2012 Document Reviewed: 03/22/2012 [facility] Patient Information 2015 [place], [place]. This information is not intended to replace advice given to you by your health care provider. Make sure you discuss any questions you have with your health care provider.

## 2015-06-07 NOTE — ED Notes (Signed)
Pt states she started her MP 2 nights ago, states her bleeding is heavier than normal and the abd. Pain is worse, states she has used approximately "30 tampons" since yesterday, states she had a tubal ligation in 2013 after her last c-section, also c/o white discharge with foul odor from navel

## 2015-06-08 LAB — CHLAMYDIA/NGC RT PCR (ARMC ONLY)
Chlamydia Tr: NOT DETECTED
N gonorrhoeae: NOT DETECTED

## 2015-06-08 NOTE — ED Notes (Signed)
Patient vomited 3 minutes after taking medication. Patient declined to stay or have VS rechecked at this time.

## 2015-10-28 ENCOUNTER — Encounter: Payer: Self-pay | Admitting: Emergency Medicine

## 2015-10-28 ENCOUNTER — Observation Stay
Admission: EM | Admit: 2015-10-28 | Discharge: 2015-11-01 | Disposition: A | Discharge status: Home / Self Care | Payer: Medicaid Other | Attending: Internal Medicine | Admitting: Internal Medicine

## 2015-10-28 ENCOUNTER — Emergency Department: Payer: Medicaid Other

## 2015-10-28 DIAGNOSIS — F419 Anxiety disorder, unspecified: Secondary | ICD-10-CM | POA: Diagnosis not present

## 2015-10-28 DIAGNOSIS — Z882 Allergy status to sulfonamides status: Secondary | ICD-10-CM | POA: Insufficient documentation

## 2015-10-28 DIAGNOSIS — Z79899 Other long term (current) drug therapy: Secondary | ICD-10-CM | POA: Diagnosis not present

## 2015-10-28 DIAGNOSIS — R0600 Dyspnea, unspecified: Secondary | ICD-10-CM | POA: Diagnosis not present

## 2015-10-28 DIAGNOSIS — J45901 Unspecified asthma with (acute) exacerbation: Principal | ICD-10-CM

## 2015-10-28 DIAGNOSIS — R05 Cough: Secondary | ICD-10-CM | POA: Diagnosis not present

## 2015-10-28 DIAGNOSIS — Z880 Allergy status to penicillin: Secondary | ICD-10-CM | POA: Diagnosis not present

## 2015-10-28 DIAGNOSIS — J45909 Unspecified asthma, uncomplicated: Secondary | ICD-10-CM | POA: Diagnosis present

## 2015-10-28 DIAGNOSIS — Z9889 Other specified postprocedural states: Secondary | ICD-10-CM | POA: Diagnosis not present

## 2015-10-28 DIAGNOSIS — Z793 Long term (current) use of hormonal contraceptives: Secondary | ICD-10-CM | POA: Diagnosis not present

## 2015-10-28 DIAGNOSIS — R0602 Shortness of breath: Secondary | ICD-10-CM | POA: Diagnosis present

## 2015-10-28 DIAGNOSIS — Z9049 Acquired absence of other specified parts of digestive tract: Secondary | ICD-10-CM | POA: Insufficient documentation

## 2015-10-28 DIAGNOSIS — R0603 Acute respiratory distress: Secondary | ICD-10-CM

## 2015-10-28 DIAGNOSIS — Z79891 Long term (current) use of opiate analgesic: Secondary | ICD-10-CM | POA: Insufficient documentation

## 2015-10-28 HISTORY — DX: Unspecified asthma, uncomplicated: J45.909

## 2015-10-28 LAB — CBC
HCT: 39.5 % (ref 35.0–47.0)
Hemoglobin: 13 g/dL (ref 12.0–16.0)
MCH: 29.4 pg (ref 26.0–34.0)
MCHC: 32.9 g/dL (ref 32.0–36.0)
MCV: 89.4 fL (ref 80.0–100.0)
PLATELETS: 292 10*3/uL (ref 150–440)
RBC: 4.41 MIL/uL (ref 3.80–5.20)
RDW: 14.7 % — ABNORMAL HIGH (ref 11.5–14.5)
WBC: 6.8 10*3/uL (ref 3.6–11.0)

## 2015-10-28 LAB — COMPREHENSIVE METABOLIC PANEL
ALK PHOS: 28 U/L — AB (ref 38–126)
ALT: 21 U/L (ref 14–54)
ANION GAP: 6 (ref 5–15)
AST: 27 U/L (ref 15–41)
Albumin: 3.9 g/dL (ref 3.5–5.0)
BUN: 12 mg/dL (ref 6–20)
CALCIUM: 8.8 mg/dL — AB (ref 8.9–10.3)
CO2: 22 mmol/L (ref 22–32)
Chloride: 109 mmol/L (ref 101–111)
Creatinine, Ser: 0.67 mg/dL (ref 0.44–1.00)
Glucose, Bld: 97 mg/dL (ref 65–99)
Potassium: 3.8 mmol/L (ref 3.5–5.1)
SODIUM: 137 mmol/L (ref 135–145)
Total Bilirubin: 0.5 mg/dL (ref 0.3–1.2)
Total Protein: 7.1 g/dL (ref 6.5–8.1)

## 2015-10-28 LAB — POCT PREGNANCY, URINE: PREG TEST UR: NEGATIVE

## 2015-10-28 MED ORDER — ACETAMINOPHEN 650 MG RE SUPP: 650.0000 mg | Freq: Four times a day (QID) | RECTAL | Status: DC | PRN

## 2015-10-28 MED ORDER — DOCUSATE SODIUM 100 MG PO CAPS
100.0000 mg | ORAL_CAPSULE | Freq: Two times a day (BID) | ORAL | Status: DC
  Administered 2015-10-28 – 2015-11-01 (×8): 100 mg via ORAL
  Filled 2015-10-28 (×8): qty 1

## 2015-10-28 MED ORDER — ACETAMINOPHEN 500 MG PO TABS
1000.0000 mg | ORAL_TABLET | Freq: Once | ORAL | Status: AC
  Administered 2015-10-28: 1000 mg via ORAL
  Filled 2015-10-28: qty 2

## 2015-10-28 MED ORDER — ALBUTEROL SULFATE (2.5 MG/3ML) 0.083% IN NEBU
2.5000 mg | INHALATION_SOLUTION | Freq: Once | RESPIRATORY_TRACT | Status: AC
  Administered 2015-10-28: 2.5 mg via RESPIRATORY_TRACT

## 2015-10-28 MED ORDER — METHYLPREDNISOLONE SODIUM SUCC 125 MG IJ SOLR
60.0000 mg | Freq: Four times a day (QID) | INTRAMUSCULAR | Status: DC
  Administered 2015-10-28 – 2015-11-01 (×15): 60 mg via INTRAVENOUS
  Filled 2015-10-28 (×16): qty 2

## 2015-10-28 MED ORDER — BUDESONIDE-FORMOTEROL FUMARATE 160-4.5 MCG/ACT IN AERO
2.0000 | INHALATION_SPRAY | Freq: Two times a day (BID) | RESPIRATORY_TRACT | Status: DC
  Administered 2015-10-28 – 2015-11-01 (×8): 2 via RESPIRATORY_TRACT
  Filled 2015-10-28: qty 6

## 2015-10-28 MED ORDER — PANTOPRAZOLE SODIUM 40 MG PO TBEC
40.0000 mg | DELAYED_RELEASE_TABLET | Freq: Every day | ORAL | Status: DC
  Administered 2015-10-28 – 2015-11-01 (×5): 40 mg via ORAL
  Filled 2015-10-28 (×5): qty 1

## 2015-10-28 MED ORDER — SODIUM CHLORIDE 0.9 % IV SOLN
INTRAVENOUS | Status: DC
  Administered 2015-10-28 – 2015-10-31 (×4): via INTRAVENOUS

## 2015-10-28 MED ORDER — METHOCARBAMOL 500 MG PO TABS
500.0000 mg | ORAL_TABLET | Freq: Four times a day (QID) | ORAL | Status: DC | PRN
  Administered 2015-10-28: 500 mg via ORAL
  Filled 2015-10-28: qty 1

## 2015-10-28 MED ORDER — KETOROLAC TROMETHAMINE 30 MG/ML IJ SOLN
30.0000 mg | Freq: Once | INTRAMUSCULAR | Status: AC
  Administered 2015-10-28: 30 mg via INTRAVENOUS
  Filled 2015-10-28: qty 1

## 2015-10-28 MED ORDER — ALBUTEROL SULFATE (2.5 MG/3ML) 0.083% IN NEBU
INHALATION_SOLUTION | RESPIRATORY_TRACT | Status: AC
Start: 2015-10-28 — End: 2015-10-28
  Filled 2015-10-28: qty 6

## 2015-10-28 MED ORDER — ALBUTEROL SULFATE (2.5 MG/3ML) 0.083% IN NEBU
5.0000 mg | INHALATION_SOLUTION | Freq: Once | RESPIRATORY_TRACT | Status: AC
  Administered 2015-10-28: 5 mg via RESPIRATORY_TRACT

## 2015-10-28 MED ORDER — IPRATROPIUM-ALBUTEROL 0.5-2.5 (3) MG/3ML IN SOLN
3.0000 mL | Freq: Four times a day (QID) | RESPIRATORY_TRACT | Status: DC
Start: 2015-10-28 — End: 2015-10-30
  Administered 2015-10-28 – 2015-10-30 (×8): 3 mL via RESPIRATORY_TRACT
  Filled 2015-10-28 (×8): qty 3

## 2015-10-28 MED ORDER — LEVOFLOXACIN IN D5W 500 MG/100ML IV SOLN
500.0000 mg | INTRAVENOUS | Status: DC
  Administered 2015-10-28 – 2015-10-30 (×3): 500 mg via INTRAVENOUS
  Filled 2015-10-28 (×5): qty 100

## 2015-10-28 MED ORDER — ALBUTEROL SULFATE HFA 108 (90 BASE) MCG/ACT IN AERS: 2.0000 | INHALATION_SPRAY | Freq: Four times a day (QID) | RESPIRATORY_TRACT | Status: DC | PRN

## 2015-10-28 MED ORDER — METHYLPREDNISOLONE SODIUM SUCC 125 MG IJ SOLR
125.0000 mg | Freq: Once | INTRAMUSCULAR | Status: AC
  Administered 2015-10-28: 125 mg via INTRAVENOUS
  Filled 2015-10-28: qty 2

## 2015-10-28 MED ORDER — ALBUTEROL SULFATE (2.5 MG/3ML) 0.083% IN NEBU
2.5000 mg | INHALATION_SOLUTION | Freq: Four times a day (QID) | RESPIRATORY_TRACT | Status: DC | PRN
  Administered 2015-10-29 – 2015-11-01 (×5): 2.5 mg via RESPIRATORY_TRACT
  Filled 2015-10-28 (×5): qty 3

## 2015-10-28 MED ORDER — OXYCODONE-ACETAMINOPHEN 7.5-325 MG PO TABS
1.0000 | ORAL_TABLET | ORAL | Status: DC | PRN
  Administered 2015-10-28 – 2015-11-01 (×18): 1 via ORAL
  Filled 2015-10-28 (×18): qty 1

## 2015-10-28 MED ORDER — ACETAMINOPHEN 325 MG PO TABS
650.0000 mg | ORAL_TABLET | Freq: Four times a day (QID) | ORAL | Status: DC | PRN
  Administered 2015-10-30: 650 mg via ORAL
  Filled 2015-10-28: qty 2

## 2015-10-28 MED ORDER — TIOTROPIUM BROMIDE MONOHYDRATE 18 MCG IN CAPS
18.0000 ug | ORAL_CAPSULE | Freq: Every day | RESPIRATORY_TRACT | Status: DC
  Administered 2015-10-28 – 2015-11-01 (×5): 18 ug via RESPIRATORY_TRACT
  Filled 2015-10-28: qty 5

## 2015-10-28 MED ORDER — BISACODYL 10 MG RE SUPP: 10.0000 mg | Freq: Every day | RECTAL | Status: DC | PRN

## 2015-10-28 MED ORDER — MEDROXYPROGESTERONE ACETATE 10 MG PO TABS
20.0000 mg | ORAL_TABLET | Freq: Three times a day (TID) | ORAL | Status: DC
  Filled 2015-10-28 (×6): qty 2

## 2015-10-28 MED ORDER — HEPARIN SODIUM (PORCINE) 5000 UNIT/ML IJ SOLN
5000.0000 [IU] | Freq: Three times a day (TID) | INTRAMUSCULAR | Status: DC
  Administered 2015-10-28 – 2015-10-30 (×7): 5000 [IU] via SUBCUTANEOUS
  Filled 2015-10-28 (×7): qty 1

## 2015-10-28 MED ORDER — PNEUMOCOCCAL VAC POLYVALENT 25 MCG/0.5ML IJ INJ: 0.5000 mL | INJECTION | INTRAMUSCULAR | Status: DC

## 2015-10-28 MED ORDER — ALPRAZOLAM 0.5 MG PO TABS
0.5000 mg | ORAL_TABLET | Freq: Three times a day (TID) | ORAL | Status: DC | PRN
  Administered 2015-10-28 – 2015-10-31 (×7): 0.5 mg via ORAL
  Filled 2015-10-28 (×7): qty 1

## 2015-10-28 MED ORDER — GUAIFENESIN ER 600 MG PO TB12
1200.0000 mg | ORAL_TABLET | Freq: Two times a day (BID) | ORAL | Status: DC
  Administered 2015-10-28 – 2015-10-29 (×3): 1200 mg via ORAL
  Filled 2015-10-28 (×4): qty 2

## 2015-10-28 MED ORDER — IPRATROPIUM-ALBUTEROL 0.5-2.5 (3) MG/3ML IN SOLN
3.0000 mL | Freq: Once | RESPIRATORY_TRACT | Status: AC
  Administered 2015-10-28: 3 mL via RESPIRATORY_TRACT
  Filled 2015-10-28: qty 3

## 2015-10-28 NOTE — ED Notes (Signed)
Pt c/o cough and difficulty breathing since Friday, states she has asthma and has been having to use her inhalers and breathing tx more than normal with no relief

## 2015-10-28 NOTE — H&P (Signed)
History and Physical    Cassandra CheersDevon A Farabaugh WUJ:[id number] DOB: 1981-10-07 DOA: 10/28/2015  Referring physician: Dr. Cyril LoosenKinner PCP: Dortha KernBLISS, LAURA K, MD  Specialists: none  Chief Complaint: SOB  HPI: Cassandra Cortez is a 34 y.o. female has a past medical history significant for asthma who presents to ER with 2 day hx of worsening SOB and cough. Given IV steroids and SVN's in ER with persistent sx's. Now admitted for further evaluation.  Review of Systems: The patient denies anorexia, fever, weight loss,, vision loss, decreased hearing, hoarseness, chest pain, syncope, peripheral edema, balance deficits, hemoptysis, abdominal pain, melena, hematochezia, severe indigestion/heartburn, hematuria, incontinence, genital sores, muscle weakness, suspicious skin lesions, transient blindness, difficulty walking, depression, unusual weight change, abnormal bleeding, enlarged lymph nodes, angioedema, and breast masses.   Past Medical History  Diagnosis Date  . Asthma    Past Surgical History  Procedure Laterality Date  . Cesarean section    . Leep    . Appendectomy     Social History:  reports that she has never smoked. She does not have any smokeless tobacco history on file. She reports that she drinks alcohol. She reports that she does not use illicit drugs.  Allergies  Allergen Reactions  . Penicillins Swelling and Rash    Has patient had a PCN reaction causing immediate rash, facial/tongue/throat swelling, SOB or lightheadedness with hypotension: Yes Has patient had a PCN reaction causing severe rash involving mucus membranes or skin necrosis: No Has patient had a PCN reaction that required hospitalization No Has patient had a PCN reaction occurring within the last 10 years: No If all of the above answers are "NO", then may proceed with Cephalosporin use.  . Sulfa Antibiotics Swelling and Rash    History reviewed. No pertinent family history.  Prior to Admission medications   Medication Sig  Start Date End Date Taking? Authorizing Provider  albuterol (PROVENTIL HFA;VENTOLIN HFA) 108 (90 BASE) MCG/ACT inhaler Inhale 2 puffs into the lungs every 6 (six) hours as needed for wheezing or shortness of breath.   Yes Historical Provider, MD  ipratropium-albuterol (DUONEB) 0.5-2.5 (3) MG/3ML SOLN Take 3 mLs by nebulization 4 (four) times daily as needed (asthma.).   Yes Historical Provider, MD  doxycycline (VIBRAMYCIN) 50 MG capsule Take 2 capsules (100 mg total) by mouth 2 (two) times daily. 06/07/15   Darien Ramusavid W Kaminski, MD  medroxyPROGESTERone (PROVERA) 10 MG tablet Take 2 tablets (20 mg total) by mouth 3 (three) times daily. 06/07/15 06/12/16  Darien Ramusavid W Kaminski, MD  oxyCODONE-acetaminophen (PERCOCET) 7.5-325 MG per tablet Take 1 tablet by mouth every 4 (four) hours as needed for severe pain. 06/07/15   Darien Ramusavid W Kaminski, MD  promethazine (PHENERGAN) 12.5 MG tablet Take 1 tablet (12.5 mg total) by mouth every 6 (six) hours as needed for nausea or vomiting. 06/07/15   Darien Ramusavid W Kaminski, MD   Physical Exam: Filed Vitals:   10/28/15 1215 10/28/15 1223 10/28/15 1230 10/28/15 1300  BP:  123/73 119/96 121/97  Pulse: 67 65 61 73  Temp:      TempSrc:      Resp:    18  Height:      Weight:      SpO2: 100% 100% 100% 100%     General:  Moderate respiratory distress talking in short phrases  Eyes: PERRL, EOMI, no scleral icterus  ENT: moist oropharynx  Neck: supple, no lymphadenopathy  Cardiovascular: regular rate without MRG; 2+ peripheral pulses, no JVD, no peripheral edema  Respiratory: diffuse rhonchi and wheezes bilaterally  Abdomen: soft, non tender to palpation, positive bowel sounds, no guarding, no rebound  Skin: no rashes  Musculoskeletal: normal bulk and tone, no joint swelling  Psychiatric: normal mood and affect  Neurologic: CN 2-12 grossly intact, MS 5/5 in all 4  Labs on Admission:  Basic Metabolic Panel:  Recent Labs Lab 10/28/15 1220  NA 137  K 3.8  CL 109  CO2  22  GLUCOSE 97  BUN 12  CREATININE 0.67  CALCIUM 8.8*   Liver Function Tests:  Recent Labs Lab 10/28/15 1220  AST 27  ALT 21  ALKPHOS 28*  BILITOT 0.5  PROT 7.1  ALBUMIN 3.9   No results for input(s): LIPASE, AMYLASE in the last 168 hours. No results for input(s): AMMONIA in the last 168 hours. CBC:  Recent Labs Lab 10/28/15 1220  WBC 6.8  HGB 13.0  HCT 39.5  MCV 89.4  PLT 292   Cardiac Enzymes: No results for input(s): CKTOTAL, CKMB, CKMBINDEX, TROPONINI in the last 168 hours.  BNP (last 3 results) No results for input(s): BNP in the last 8760 hours.  ProBNP (last 3 results) No results for input(s): PROBNP in the last 8760 hours.  CBG: No results for input(s): GLUCAP in the last 168 hours.  Radiological Exams on Admission: Dg Chest 2 View  10/28/2015  CLINICAL DATA:  History of asthma, worsening shortness of breath and cough for 2 days. EXAM: CHEST  2 VIEW COMPARISON:  Chest x-ray dated 11/22/2014. FINDINGS: The heart size and mediastinal contours are within normal limits. Both lungs are clear. Lung volumes are normal. No pleural effusion. No pneumothorax. The visualized skeletal structures are unremarkable. IMPRESSION: Normal chest x-ray. Electronically Signed   By: Bary Richard M.D.   On: 10/28/2015 10:43    EKG: Independently reviewed.  Assessment/Plan Active Problems:   Asthma exacerbation   Acute respiratory distress (HCC)   Will observe on floor with IV steroids, IV ABX, and SVN's. Repeat labs and CXR in AM.  Diet: heart healthy Fluids: NS@75  DVT Prophylaxis: SQ Heparin  Code Status: FULL  Family Communication: yes  Disposition Plan: home  Time spent: 45 min

## 2015-10-28 NOTE — ED Provider Notes (Signed)
[facility] Emergency Department Provider Note  ____________________________________________  Time seen: 11 AM  I have reviewed the triage vital signs and the nursing notes.   HISTORY  Chief Complaint Cough and Asthma    HPI Cassandra Cortez is a 34 y.o. female who presents with shortness of breath and cough for approximately 2 days. She has a history of asthma and has been using her inhalers at home but has not gained any relief. Her cough is nonproductive. She denies fevers or chills. No recent travel. No calf pain or swelling. She has a history of asthma exacerbations that of required hospitalization but she has never been intubated. Reports that any activity makes her feel like she is gasping for air     Past Medical History  Diagnosis Date  . Asthma     There are no active problems to display for this patient.   Past Surgical History  Procedure Laterality Date  . Cesarean section    . Leep    . Appendectomy      Current Outpatient Rx  Name  Route  Sig  Dispense  Refill  . albuterol (PROVENTIL HFA;VENTOLIN HFA) 108 (90 BASE) MCG/ACT inhaler   Inhalation   Inhale 2 puffs into the lungs every 6 (six) hours as needed for wheezing or shortness of breath.         Marland Kitchen ipratropium-albuterol (DUONEB) 0.5-2.5 (3) MG/3ML SOLN   Nebulization   Take 3 mLs by nebulization 4 (four) times daily as needed (asthma.).         Marland Kitchen doxycycline (VIBRAMYCIN) 50 MG capsule   Oral   Take 2 capsules (100 mg total) by mouth 2 (two) times daily.   28 capsule   0   . medroxyPROGESTERone (PROVERA) 10 MG tablet   Oral   Take 2 tablets (20 mg total) by mouth 3 (three) times daily.   40 tablet   0   . oxyCODONE-acetaminophen (PERCOCET) 7.5-325 MG per tablet   Oral   Take 1 tablet by mouth every 4 (four) hours as needed for severe pain.   12 tablet   0   . promethazine (PHENERGAN) 12.5 MG tablet   Oral   Take 1 tablet (12.5 mg total) by mouth every 6 (six)  hours as needed for nausea or vomiting.   12 tablet   0     Allergies Penicillins and Sulfa antibiotics  No family history on file.  Social History Social History  Substance Use Topics  . Smoking status: Never Smoker   . Smokeless tobacco: None  . Alcohol Use: Yes    Review of Systems  Constitutional: Negative for fever. Eyes: Negative for visual changes. ENT: Negative for sore throat Cardiovascular: Negative for chest pain. Respiratory: As above for shortness of breath. Positive for cough Gastrointestinal: Negative for abdominal pain, vomiting and diarrhea. Genitourinary: Negative for dysuria. Musculoskeletal: Mild back pain Skin: Negative for rash. Neurological: Negative for headaches or focal weakness Psychiatric: Mild anxiety    ____________________________________________   PHYSICAL EXAM:  VITAL SIGNS: ED Triage Vitals  Enc Vitals Group     BP 10/28/15 1006 139/92 mmHg     Pulse Rate 10/28/15 1006 108     Resp 10/28/15 1006 24     Temp 10/28/15 1006 98 F (36.7 C)     Temp Source 10/28/15 1006 Oral     SpO2 10/28/15 1006 99 %     Weight 10/28/15 1006 220 lb (99.791 kg)  Height 10/28/15 1006 5\' 1"  (1.549 m)     Head Cir --      Peak Flow --      Pain Score 10/28/15 1007 6     Pain Loc --      Pain Edu? --      Excl. in GC? --      Constitutional: Alert and oriented. Audibly wheezing Eyes: Conjunctivae are normal.  ENT   Head: Normocephalic and atraumatic.   Mouth/Throat: Mucous membranes are moist. Cardiovascular: Tachycardia, regular rhythm. Normal and symmetric distal pulses are present in all extremities. No murmurs, rubs, or gallops. Respiratory: Mild tachypnea, wheezes bilaterally   Gastrointestinal: Soft and non-tender in all quadrants. No distention. There is no CVA tenderness. Genitourinary: deferred Musculoskeletal: Nontender with normal range of motion in all extremities. No lower extremity tenderness nor edema. Neurologic:   Normal speech and language. No gross focal neurologic deficits are appreciated. Skin:  Skin is warm, dry and intact. No rash noted. Psychiatric: Mood and affect are normal. Patient exhibits appropriate insight and judgment.  ____________________________________________    LABS (pertinent positives/negatives)  Labs Reviewed  CBC - Abnormal; Notable for the following:    RDW 14.7 (*)    All other components within normal limits  COMPREHENSIVE METABOLIC PANEL - Abnormal; Notable for the following:    Calcium 8.8 (*)    Alkaline Phosphatase 28 (*)    All other components within normal limits  POC URINE PREG, ED  POCT PREGNANCY, URINE    ____________________________________________   EKG  None  ____________________________________________    RADIOLOGY I have personally reviewed any xrays that were ordered on this patient: Chest x-ray unremarkable  ____________________________________________   PROCEDURES  Procedure(s) performed: none  Critical Care performed: none  ____________________________________________   INITIAL IMPRESSION / ASSESSMENT AND PLAN / ED COURSE  Pertinent labs & imaging results that were available during my care of the patient were reviewed by me and considered in my medical decision making (see chart for details).  Patient resents with diffuse wheezing and shortness of breath consistent with asthma exacerbation. We will treat with nebulizers and steroids and reevaluate   No improvement after treatment, patient still is audibly wheezing and feels short of breath. She'll require admission for further management  ____________________________________________   FINAL CLINICAL IMPRESSION(S) / ED DIAGNOSES  Final diagnoses:  Asthma exacerbation     Jene Everyobert Gavyn Ybarra, MD 10/28/15 1404

## 2015-10-29 ENCOUNTER — Observation Stay: Payer: Medicaid Other

## 2015-10-29 LAB — CBC
HCT: 37.7 % (ref 35.0–47.0)
Hemoglobin: 12.7 g/dL (ref 12.0–16.0)
MCH: 30.3 pg (ref 26.0–34.0)
MCHC: 33.6 g/dL (ref 32.0–36.0)
MCV: 90.1 fL (ref 80.0–100.0)
PLATELETS: 298 10*3/uL (ref 150–440)
RBC: 4.19 MIL/uL (ref 3.80–5.20)
RDW: 14.6 % — ABNORMAL HIGH (ref 11.5–14.5)
WBC: 11.7 10*3/uL — ABNORMAL HIGH (ref 3.6–11.0)

## 2015-10-29 LAB — BASIC METABOLIC PANEL
Anion gap: 8 (ref 5–15)
BUN: 13 mg/dL (ref 6–20)
CALCIUM: 8.7 mg/dL — AB (ref 8.9–10.3)
CO2: 22 mmol/L (ref 22–32)
CREATININE: 0.65 mg/dL (ref 0.44–1.00)
Chloride: 109 mmol/L (ref 101–111)
GFR calc non Af Amer: >60 mL/min (ref >60)
GLUCOSE: 149 mg/dL — AB (ref 65–99)
Potassium: 3.9 mmol/L (ref 3.5–5.1)
Sodium: 139 mmol/L (ref 135–145)

## 2015-10-29 LAB — GLUCOSE, CAPILLARY: GLUCOSE-CAPILLARY: 158 mg/dL — AB (ref 65–99)

## 2015-10-29 MED ORDER — GUAIFENESIN-CODEINE 100-10 MG/5ML PO SOLN
5.0000 mL | ORAL | Status: DC | PRN
  Administered 2015-10-29 – 2015-11-01 (×13): 5 mL via ORAL
  Filled 2015-10-29 (×13): qty 5

## 2015-10-29 MED ORDER — CEPASTAT 14.5 MG MT LOZG
1.0000 | LOZENGE | OROMUCOSAL | Status: DC | PRN
Start: 2015-10-29 — End: 2015-11-01

## 2015-10-29 MED ORDER — BENZONATATE 100 MG PO CAPS
100.0000 mg | ORAL_CAPSULE | Freq: Three times a day (TID) | ORAL | Status: DC | PRN
  Administered 2015-10-29 – 2015-11-01 (×8): 100 mg via ORAL
  Filled 2015-10-29 (×8): qty 1

## 2015-10-29 NOTE — Progress Notes (Signed)
[facility] - [place] at [facility]   PATIENT NAME: Cassandra Cortez    MR#:  [id number]  DATE OF BIRTH:  Aug 09, 1981  SUBJECTIVE:  CHIEF COMPLAINT:   Chief Complaint  Patient presents with  . Cough  . Asthma    REVIEW OF SYSTEMS:  CONSTITUTIONAL: No fever, fatigue or weakness.  EYES: No blurred or double vision.  EARS, NOSE, AND THROAT: No tinnitus or ear pain.  RESPIRATORY: have severe cough, some shortness of breath,  Mild wheezing, no hemoptysis.  CARDIOVASCULAR: No chest pain, orthopnea, edema.  GASTROINTESTINAL: No nausea, vomiting, diarrhea or abdominal pain.  GENITOURINARY: No dysuria, hematuria.  ENDOCRINE: No polyuria, nocturia,  HEMATOLOGY: No anemia, easy bruising or bleeding SKIN: No rash or lesion. MUSCULOSKELETAL: No joint pain or arthritis.   NEUROLOGIC: No tingling, numbness, weakness.  PSYCHIATRY: No anxiety or depression.   ROS  DRUG ALLERGIES:   Allergies  Allergen Reactions  . Penicillins Swelling and Rash    Has patient had a PCN reaction causing immediate rash, facial/tongue/throat swelling, SOB or lightheadedness with hypotension: Yes Has patient had a PCN reaction causing severe rash involving mucus membranes or skin necrosis: No Has patient had a PCN reaction that required hospitalization No Has patient had a PCN reaction occurring within the last 10 years: No If all of the above answers are "NO", then may proceed with Cephalosporin use.  . Sulfa Antibiotics Swelling and Rash    VITALS:  Blood pressure 142/74, pulse 109, temperature 98.1 F (36.7 C), temperature source Oral, resp. rate 18, height 5\' 1"  (1.549 m), weight 101.243 kg (223 lb 3.2 oz), last menstrual period 10/05/2015, SpO2 100 %.  PHYSICAL EXAMINATION:  GENERAL:  34 y.o.-year-old patient lying in the bed with no acute distress.  EYES: Pupils equal, round, reactive to light and accommodation. No scleral icterus. Extraocular muscles intact.  HEENT: Head  atraumatic, normocephalic. Oropharynx and nasopharynx clear.  NECK:  Supple, no jugular venous distention. No thyroid enlargement, no tenderness.  LUNGS: equal breath sounds bilaterally, some wheezing, no crepitation. No use of accessory muscles of respiration.  CARDIOVASCULAR: S1, S2 normal. No murmurs, rubs, or gallops.  ABDOMEN: Soft, nontender, nondistended. Bowel sounds present. No organomegaly or mass.  EXTREMITIES: No pedal edema, cyanosis, or clubbing.  NEUROLOGIC: Cranial nerves II through XII are intact. Muscle strength 5/5 in all extremities. Sensation intact. Gait not checked.  PSYCHIATRIC: The patient is alert and oriented x 3.  SKIN: No obvious rash, lesion, or ulcer.   Physical Exam LABORATORY PANEL:   CBC  Recent Labs Lab 10/29/15 0541  WBC 11.7*  HGB 12.7  HCT 37.7  PLT 298   ------------------------------------------------------------------------------------------------------------------  Chemistries   Recent Labs Lab 10/28/15 1220 10/29/15 0541  NA 137 139  K 3.8 3.9  CL 109 109  CO2 22 22  GLUCOSE 97 149*  BUN 12 13  CREATININE 0.67 0.65  CALCIUM 8.8* 8.7*  AST 27  --   ALT 21  --   ALKPHOS 28*  --   BILITOT 0.5  --    ------------------------------------------------------------------------------------------------------------------  Cardiac Enzymes No results for input(s): TROPONINI in the last 168 hours. ------------------------------------------------------------------------------------------------------------------  RADIOLOGY:  Dg Chest 2 View  10/29/2015  CLINICAL DATA:  Shortness of breath EXAM: CHEST  2 VIEW COMPARISON:  Chest radiograph from one day prior. FINDINGS: Stable cardiomediastinal silhouette with normal heart size. No pneumothorax. No pleural effusion. Clear lungs, with no focal lung consolidation and no pulmonary edema. IMPRESSION: No active cardiopulmonary disease. Electronically  Signed   By: Delbert Phenix M.D.   On:  10/29/2015 08:07   Dg Chest 2 View  10/28/2015  CLINICAL DATA:  History of asthma, worsening shortness of breath and cough for 2 days. EXAM: CHEST  2 VIEW COMPARISON:  Chest x-ray dated 11/22/2014. FINDINGS: The heart size and mediastinal contours are within normal limits. Both lungs are clear. Lung volumes are normal. No pleural effusion. No pneumothorax. The visualized skeletal structures are unremarkable. IMPRESSION: Normal chest x-ray. Electronically Signed   By: Bary Richard M.D.   On: 10/28/2015 10:43    ASSESSMENT AND PLAN:   Active Problems:   Asthma exacerbation   Acute respiratory distress (HCC)   Asthma  - IV steroids, and Nebs - IV levaquin. - Spiriva.  COugh  - Robitussin and losenges.   All the records are reviewed and case discussed with Care Management/Social Workerr. Management plans discussed with the patient, family and they are in agreement.  CODE STATUS: full  TOTAL TIME TAKING CARE OF THIS PATIENT: 35 minutes.   POSSIBLE D/C IN 1-2 DAYS, DEPENDING ON CLINICAL CONDITION.   Altamese Dilling M.D on 10/29/2015   Between 7am to 6pm - Pager - [phone]  After 6pm go to [email or web address] - password [email or web address] [facility]  [place] [facility]  Office  [phone]  CC: Primary care physician; Dortha Kern, MD  Note: This dictation was prepared with Dragon dictation along with smaller phrase technology. Any transcriptional errors that result from this process are unintentional.

## 2015-10-29 NOTE — Progress Notes (Signed)
Notified Dr Desiree HaneVachani of pt request for additional medication for cough; Dr acknowledged, stated he would put in orders

## 2015-10-30 LAB — GLUCOSE, CAPILLARY: GLUCOSE-CAPILLARY: 119 mg/dL — AB (ref 65–99)

## 2015-10-30 MED ORDER — ENOXAPARIN SODIUM 40 MG/0.4ML ~~LOC~~ SOLN
40.0000 mg | SUBCUTANEOUS | Status: DC
  Administered 2015-10-30 – 2015-10-31 (×2): 40 mg via SUBCUTANEOUS
  Filled 2015-10-30 (×2): qty 0.4

## 2015-10-30 MED ORDER — HYDRALAZINE HCL 20 MG/ML IJ SOLN
10.0000 mg | INTRAMUSCULAR | Status: DC | PRN
  Administered 2015-10-30 – 2015-11-01 (×2): 10 mg via INTRAVENOUS
  Filled 2015-10-30 (×2): qty 1

## 2015-10-30 MED ORDER — BUDESONIDE 0.25 MG/2ML IN SUSP
0.2500 mg | Freq: Two times a day (BID) | RESPIRATORY_TRACT | Status: DC
  Administered 2015-10-30 – 2015-11-01 (×4): 0.25 mg via RESPIRATORY_TRACT
  Filled 2015-10-30 (×5): qty 2

## 2015-10-30 NOTE — Progress Notes (Signed)
Pt b/p elevated. Spoke with dr.willis. Orders in.

## 2015-10-30 NOTE — Progress Notes (Signed)
[facility] - [place] at [facility]   PATIENT NAME: Cassandra Cortez    MR#:  [id number]  DATE OF BIRTH:  08-20-1981  SUBJECTIVE:  CHIEF COMPLAINT:   Chief Complaint  Patient presents with  . Cough  . Asthma    She says it still had episodes of excessive coughing last night. And she feels  Severe wheezing. REVIEW OF SYSTEMS:  CONSTITUTIONAL: No fever, fatigue or weakness.  EYES: No blurred or double vision.  EARS, NOSE, AND THROAT: No tinnitus or ear pain.  RESPIRATORY: have severe cough, some shortness of breath, severe wheezing, no hemoptysis.  CARDIOVASCULAR: No chest pain, orthopnea, edema.  GASTROINTESTINAL: No nausea, vomiting, diarrhea or abdominal pain.  GENITOURINARY: No dysuria, hematuria.  ENDOCRINE: No polyuria, nocturia,  HEMATOLOGY: No anemia, easy bruising or bleeding SKIN: No rash or lesion. MUSCULOSKELETAL: No joint pain or arthritis.   NEUROLOGIC: No tingling, numbness, weakness.  PSYCHIATRY: No anxiety or depression.   ROS  DRUG ALLERGIES:   Allergies  Allergen Reactions  . Penicillins Swelling and Rash    Has patient had a PCN reaction causing immediate rash, facial/tongue/throat swelling, SOB or lightheadedness with hypotension: Yes Has patient had a PCN reaction causing severe rash involving mucus membranes or skin necrosis: No Has patient had a PCN reaction that required hospitalization No Has patient had a PCN reaction occurring within the last 10 years: No If all of the above answers are "NO", then may proceed with Cephalosporin use.  . Sulfa Antibiotics Swelling and Rash    VITALS:  Blood pressure 145/109, pulse 98, temperature 98.2 F (36.8 C), temperature source Oral, resp. rate 20, height 5\' 1"  (1.549 m), weight 102.967 kg (227 lb), last menstrual period 10/05/2015, SpO2 99 %.  PHYSICAL EXAMINATION:  GENERAL:  34 y.o.-year-old patient lying in the bed with no acute distress. But having intermittent coughs during my  interview. EYES: Pupils equal, round, reactive to light and accommodation. No scleral icterus. Extraocular muscles intact.  HEENT: Head atraumatic, normocephalic. Oropharynx and nasopharynx clear.  NECK:  Supple, no jugular venous distention. No thyroid enlargement, no tenderness.  LUNGS: equal breath sounds bilaterally, expiratory wheezing, no crepitation. No use of accessory muscles of respiration. Have cough while talking to me. CARDIOVASCULAR: S1, S2 normal. No murmurs, rubs, or gallops.  ABDOMEN: Soft, nontender, nondistended. Bowel sounds present. No organomegaly or mass.  EXTREMITIES: No pedal edema, cyanosis, or clubbing.  NEUROLOGIC: Cranial nerves II through XII are intact. Muscle strength 5/5 in all extremities. Sensation intact. Gait not checked.  PSYCHIATRIC: The patient is alert and oriented x 3.  SKIN: No obvious rash, lesion, or ulcer.   Physical Exam LABORATORY PANEL:   CBC  Recent Labs Lab 10/29/15 0541  WBC 11.7*  HGB 12.7  HCT 37.7  PLT 298   ------------------------------------------------------------------------------------------------------------------  Chemistries   Recent Labs Lab 10/28/15 1220 10/29/15 0541  NA 137 139  K 3.8 3.9  CL 109 109  CO2 22 22  GLUCOSE 97 149*  BUN 12 13  CREATININE 0.67 0.65  CALCIUM 8.8* 8.7*  AST 27  --   ALT 21  --   ALKPHOS 28*  --   BILITOT 0.5  --    ------------------------------------------------------------------------------------------------------------------  Cardiac Enzymes No results for input(s): TROPONINI in the last 168 hours. ------------------------------------------------------------------------------------------------------------------  RADIOLOGY:  Dg Chest 2 View  10/29/2015  CLINICAL DATA:  Shortness of breath EXAM: CHEST  2 VIEW COMPARISON:  Chest radiograph from one day prior. FINDINGS: Stable cardiomediastinal silhouette with  normal heart size. No pneumothorax. No pleural effusion.  Clear lungs, with no focal lung consolidation and no pulmonary edema. IMPRESSION: No active cardiopulmonary disease. Electronically Signed   By: Delbert Phenix M.D.   On: 10/29/2015 08:07    ASSESSMENT AND PLAN:   Active Problems:   Asthma exacerbation   Acute respiratory distress (HCC)   Asthma  - IV steroids, and Nebs - IV levaquin. - Spiriva. - Add inhaled steroid as she is not improving enough. - We'll repeat chest x-ray tomorrow, to check for any other pathologies.  COugh  - Robitussin and losenges.  - Encouraged to ask for cough drops and medications to the nurse whenever she feels to.  Anxiety - Continue Xanax  All the records are reviewed and case discussed with Care Management/Social Workerr. Management plans discussed with the patient, family and they are in agreement.  CODE STATUS: full  TOTAL TIME TAKING CARE OF THIS PATIENT: 35 minutes.   POSSIBLE D/C IN 1-2 DAYS, DEPENDING ON CLINICAL CONDITION.   Altamese Dilling M.D on 10/30/2015   Between 7am to 6pm - Pager - [phone]  After 6pm go to [email or web address] - password [email or web address] [facility]  [place] [facility]  Office  [phone]  CC: Primary care physician; Dortha Kern, MD  Note: This dictation was prepared with Dragon dictation along with smaller phrase technology. Any transcriptional errors that result from this process are unintentional.

## 2015-10-31 ENCOUNTER — Observation Stay: Payer: Medicaid Other

## 2015-10-31 LAB — GLUCOSE, CAPILLARY: Glucose-Capillary: 124 mg/dL — ABNORMAL HIGH (ref 65–99)

## 2015-10-31 MED ORDER — MONTELUKAST SODIUM 10 MG PO TABS
10.0000 mg | ORAL_TABLET | Freq: Every day | ORAL | Status: DC
Start: 2015-10-31 — End: 2015-11-01
  Administered 2015-10-31: 10 mg via ORAL
  Filled 2015-10-31: qty 1

## 2015-10-31 MED ORDER — LEVOFLOXACIN 500 MG PO TABS
500.0000 mg | ORAL_TABLET | Freq: Every day | ORAL | Status: DC
Start: 2015-10-31 — End: 2015-11-01
  Administered 2015-10-31 – 2015-11-01 (×2): 500 mg via ORAL
  Filled 2015-10-31 (×2): qty 1

## 2015-10-31 NOTE — Progress Notes (Signed)
Date: 10/31/2015,   MRN# [id number] Gaylyn CheersDevon A Vanderweide [birth year] Code Status:     Code Status Orders        Start     Ordered   10/28/15 1554  Full code   Continuous     10/28/15 1553     Hosp day:@LENGTHOFSTAYDAYS @ Referring MD: @ATDPROV @       CC: Asthma Attack  HPI: This is a 34 year old lady, non smoker  comes in with cough, wheezing and chest tightness. She had been on nebs but as before, the nebs were unable to reverse the bronchospasm. Her chest xray was clear. No acute exposure to noxious inhalants. No pets, mold or exposure to feathered pillows. She has had prednisone x 1 this year ( earlier this year). No history of prolong intubation. Diagnosed with asthma at age 34. She works at [employer]. Present;y still wheezing, cough, chest wall soreness but no gerd or rhinitis.   PMHX:   Past Medical History  Diagnosis Date  . Asthma    Surgical Hx:  Past Surgical History  Procedure Laterality Date  . Cesarean section    . Leep    . Appendectomy     Family Hx:  History reviewed. No pertinent family history. Social Hx:   Social History  Substance Use Topics  . Smoking status: Never Smoker   . Smokeless tobacco: None  . Alcohol Use: Yes   Medication:    Home Medication:  No current outpatient prescriptions on file.  Current Medication: @CURMEDTAB @   Allergies:  Penicillins and Sulfa antibiotics  Review of Systems: Gen:  Denies  fever, sweats, chills HEENT: Denies blurred vision, double vision, ear pain, eye pain, hearing loss, nose bleeds, sore throat Cvc:  No dizziness, chest pain or heaviness Resp:   Wheezing, coughing, chest wall soreness, no hemoptysis Gi: Denies swallowing difficulty, stomach pain, nausea or vomiting, diarrhea, constipation, bowel incontinence Gu:  Denies bladder incontinence, burning urine Ext:   No Joint pain, stiffness or swelling Skin: No skin rash, easy bruising or bleeding or hives Endoc:  No polyuria, polydipsia , polyphagia or  weight change Psych: No depression, insomnia or hallucinations  Other:  All other systems negative  Physical Examination:   VS: BP 122/64 mmHg  Pulse 77  Temp(Src) 98.4 F (36.9 C) (Oral)  Resp 20  Ht 5\' 1"  (1.549 m)  Wt 227 lb (102.967 kg)  BMI 42.91 kg/m2  SpO2 100%  LMP 10/05/2015  General Appearance: Over weight, coughing frequently, audible whhezing  Neuro: without focal findings, mental status, speech normal, alert and oriented, cranial nerves 2-12 intact, reflexes normal and symmetric, sensation grossly normal  HEENT: PERRLA, EOM intact, no ptosis, no other lesions noticed, Mallampati: Pulmonary:.+ ve  wheezing, + ve rhonchi, No rales  Sputum Production:  Coughing frequently Cardiovascular:  Normal S1,S2.  No m/r/g.    Abdomen:Benign, Soft, non-tender, No masses, hepatosplenomegaly, No lymphadenopathy Endoc: No evident thyromegaly, no signs of acromegaly or Cushing features Skin:   warm, no rashes, no ecchymosis  Extremities: normal, no cyanosis, clubbing, no edema, warm with normal capillary refill.    Labs results:   Recent Labs     10/29/15  0541  HGB  12.7  HCT  37.7  MCV  90.1  WBC  11.7*  BUN  13  CREATININE  0.65  GLUCOSE  149*  CALCIUM  8.7*  ,    Rad results:   Dg Chest 2 View  10/31/2015  CLINICAL DATA:  Follow-up asthma top,  cough, congestion EXAM: CHEST  2 VIEW COMPARISON:  10/29/2015 FINDINGS: Cardiomediastinal silhouette is stable. No acute infiltrate or pleural effusion. No pulmonary edema. Bony thorax is unremarkable. IMPRESSION: No active cardiopulmonary disease. Electronically Signed   By: Natasha Mead M.D.   On: 10/31/2015 08:52   Assessment and Plan:  Asthma attack/flare, still quite bronchospastic, no obvious trigger.  Check IGE, eosinophil count singulair 10 mg q day Laba, ICS Nebs with albuterol q 4 hours Solumedrol as is emperic antibiotics Dvt prophylaxis    I have personally obtained a history, examined the patient, evaluated  laboratory and imaging results, formulated the assessment and plan and placed orders.  The Patient requires high complexity decision making for assessment and support, frequent evaluation and titration of therapies, application of advanced monitoring technologies and extensive interpretation of multiple databases.   Jozsef Wescoat,M.D. Pulmonary & Critical care Medicine [facility]

## 2015-10-31 NOTE — Progress Notes (Signed)
[facility] -  at [facility]   PATIENT NAME: Cassandra Cortez    MR#:  [id number]  DATE OF BIRTH:  Sep 25, 1981  SUBJECTIVE:  CHIEF COMPLAINT:   Chief Complaint  Patient presents with  . Cough  . Asthma   Still  excessive coughing and wheezing. Bilateral rib pain due to cough. REVIEW OF SYSTEMS:  CONSTITUTIONAL: No fever, fatigue or weakness.  EYES: No blurred or double vision.  EARS, NOSE, AND THROAT: No tinnitus or ear pain.  RESPIRATORY: have cough, shortness of breath and wheezing, no hemoptysis.  CARDIOVASCULAR: No chest pain, orthopnea, edema.  GASTROINTESTINAL: No nausea, vomiting, diarrhea or abdominal pain.  GENITOURINARY: No dysuria, hematuria.  ENDOCRINE: No polyuria, nocturia,  HEMATOLOGY: No anemia, easy bruising or bleeding SKIN: No rash or lesion. MUSCULOSKELETAL: No joint pain or arthritis.   NEUROLOGIC: No tingling, , weakness or numbness.  PSYCHIATRY: No anxiety or depression.   ROS  DRUG ALLERGIES:   Allergies  Allergen Reactions  . Penicillins Swelling and Rash    Has patient had a PCN reaction causing immediate rash, facial/tongue/throat swelling, SOB or lightheadedness with hypotension: Yes Has patient had a PCN reaction causing severe rash involving mucus membranes or skin necrosis: No Has patient had a PCN reaction that required hospitalization No Has patient had a PCN reaction occurring within the last 10 years: No If all of the above answers are "NO", then may proceed with Cephalosporin use.  . Sulfa Antibiotics Swelling and Rash    VITALS:  Blood pressure 122/64, pulse 77, temperature 98.4 F (36.9 C), temperature source Oral, resp. rate 20, height  (1.549 m), weight 102.967 kg (227 lb), last menstrual period 10/05/2015, SpO2 100 %.  PHYSICAL EXAMINATION:  GENERAL:  34 y.o.-year-old patient lying in the bed with no acute distress. But having intermittent coughs. Obese. EYES: Pupils equal, round, reactive to  light and accommodation. No scleral icterus. Extraocular muscles intact.  HEENT: Head atraumatic, normocephalic. Oropharynx and nasopharynx clear.  NECK:  Supple, no jugular venous distention. No thyroid enlargement, no tenderness.  LUNGS: equal breath sounds bilaterally,  moderate expiratory wheezing, no crepitation. No use of accessory muscles of respiration.  Cough during physical exam.  CARDIOVASCULAR: S1, S2 normal. No murmurs, rubs, or gallops.  ABDOMEN: Soft, nontender, nondistended. Bowel sounds present. No organomegaly or mass.  EXTREMITIES: No pedal edema, cyanosis, or clubbing.  NEUROLOGIC: Cranial nerves II through XII are intact. Muscle strength 5/5 in all extremities. Sensation intact. Gait not checked.  PSYCHIATRIC: The patient is alert and oriented x 3.  SKIN: No obvious rash, lesion, or ulcer.   Physical Exam LABORATORY PANEL:   CBC  Recent Labs Lab 10/29/15 0541  WBC 11.7*  HGB 12.7  HCT 37.7  PLT 298   ------------------------------------------------------------------------------------------------------------------  Chemistries   Recent Labs Lab 10/28/15 1220 10/29/15 0541  NA 137 139  K 3.8 3.9  CL 109 109  CO2 22 22  GLUCOSE 97 149*  BUN 12 13  CREATININE 0.67 0.65  CALCIUM 8.8* 8.7*  AST 27  --   ALT 21  --   ALKPHOS 28*  --   BILITOT 0.5  --    ------------------------------------------------------------------------------------------------------------------  Cardiac Enzymes No results for input(s): TROPONINI in the last 168 hours. ------------------------------------------------------------------------------------------------------------------  RADIOLOGY:  Dg Chest 2 View  10/31/2015  CLINICAL DATA:  Follow-up asthma top, cough, congestion EXAM: CHEST  2 VIEW COMPARISON:  10/29/2015 FINDINGS: Cardiomediastinal silhouette is stable. No acute infiltrate or pleural effusion. No pulmonary edema.  Bony thorax is unremarkable. IMPRESSION: No  active cardiopulmonary disease. Electronically Signed   By: Natasha MeadLiviu  Pop M.D.   On: 10/31/2015 08:52    ASSESSMENT AND PLAN:   Active Problems:   Asthma exacerbation   Acute respiratory distress (HCC)   Asthma  - IV steroids, and Nebs, changed to by mouth levaquin. Continue Pulmicort and Spiriva. Per Dr. Mayo AoFlemming, Check IGE, eosinophil count, Add singulair 10 mg q day, Laba, ICS Nebs with albuterol q 4 hours, Solumedrol as is, emperic antibiotics  Cough  - Robitussin, Tessalon and losenges.  Anxiety - Continue Xanax  All the records are reviewed and case discussed with Care Management/Social Workerr. Management plans discussed with the patient, family and they are in agreement. Greater than 50% time was spent on coordination of care and face-to-face counseling.  CODE STATUS: full  TOTAL TIME TAKING CARE OF THIS PATIENT: 37 minutes.   POSSIBLE D/C IN 1-2 DAYS, DEPENDING ON CLINICAL CONDITION.   Shaune Pollackhen, Reymundo Winship M.D on 10/31/2015   Between 7am to 6pm - Pager - [phone]  After 6pm go to [email or web address] - password [email or web address] [facility]  [facility]  Office  [phone]  CC: Primary care physician; Dortha KernBLISS, LAURA K, MD  Note: This dictation was prepared with Dragon dictation along with smaller phrase technology. Any transcriptional errors that result from this process are unintentional.

## 2015-11-01 LAB — CBC WITH DIFFERENTIAL/PLATELET
Basophils Absolute: 0 K/uL (ref 0–0.1)
Basophils Relative: 0 %
Eosinophils Absolute: 0 K/uL (ref 0–0.7)
Eosinophils Relative: 0 %
HCT: 40.1 % (ref 35.0–47.0)
Hemoglobin: 13.2 g/dL (ref 12.0–16.0)
Lymphocytes Relative: 12 %
Lymphs Abs: 1.7 K/uL (ref 1.0–3.6)
MCH: 29.2 pg (ref 26.0–34.0)
MCHC: 32.9 g/dL (ref 32.0–36.0)
MCV: 88.7 fL (ref 80.0–100.0)
Monocytes Absolute: 0.6 K/uL (ref 0.2–0.9)
Monocytes Relative: 4 %
Neutro Abs: 11.8 K/uL — ABNORMAL HIGH (ref 1.4–6.5)
Neutrophils Relative %: 84 %
Platelets: 336 K/uL (ref 150–440)
RBC: 4.52 MIL/uL (ref 3.80–5.20)
RDW: 14.8 % — ABNORMAL HIGH (ref 11.5–14.5)
WBC: 14.2 K/uL — ABNORMAL HIGH (ref 3.6–11.0)

## 2015-11-01 LAB — CREATININE, SERUM
Creatinine, Ser: 0.67 mg/dL (ref 0.44–1.00)
GFR calc Af Amer: >60 mL/min
GFR calc non Af Amer: >60 mL/min

## 2015-11-01 LAB — GLUCOSE, CAPILLARY: Glucose-Capillary: 120 mg/dL — ABNORMAL HIGH (ref 65–99)

## 2015-11-01 MED ORDER — MONTELUKAST SODIUM 10 MG PO TABS: 10.0000 mg | ORAL_TABLET | Freq: Every day | ORAL | Status: DC

## 2015-11-01 MED ORDER — TIOTROPIUM BROMIDE MONOHYDRATE 18 MCG IN CAPS: 18.0000 ug | ORAL_CAPSULE | Freq: Every day | RESPIRATORY_TRACT | Status: DC

## 2015-11-01 MED ORDER — LEVOFLOXACIN 500 MG PO TABS: 500.0000 mg | ORAL_TABLET | Freq: Every day | ORAL | Status: DC

## 2015-11-01 MED ORDER — ALBUTEROL SULFATE HFA 108 (90 BASE) MCG/ACT IN AERS: 2.0000 | INHALATION_SPRAY | Freq: Four times a day (QID) | RESPIRATORY_TRACT | Status: DC | PRN

## 2015-11-01 MED ORDER — BENZONATATE 100 MG PO CAPS: 100.0000 mg | ORAL_CAPSULE | Freq: Three times a day (TID) | ORAL | Status: DC | PRN

## 2015-11-01 MED ORDER — IPRATROPIUM-ALBUTEROL 0.5-2.5 (3) MG/3ML IN SOLN: 3.0000 mL | Freq: Four times a day (QID) | RESPIRATORY_TRACT | Status: DC | PRN

## 2015-11-01 MED ORDER — GUAIFENESIN-CODEINE 100-10 MG/5ML PO SOLN: 5.0000 mL | ORAL | Status: DC | PRN

## 2015-11-01 MED ORDER — BUDESONIDE-FORMOTEROL FUMARATE 160-4.5 MCG/ACT IN AERO: 2.0000 | INHALATION_SPRAY | Freq: Two times a day (BID) | RESPIRATORY_TRACT | Status: DC

## 2015-11-01 MED ORDER — PREDNISONE 5 MG PO TABS: ORAL_TABLET | ORAL | Status: DC

## 2015-11-01 NOTE — Discharge Summary (Signed)
[facility] -  at [facility]   PATIENT NAME: Cassandra Cortez    MR#:  [id number]  DATE OF BIRTH:  11/13/1981  DATE OF ADMISSION:  10/28/2015 ADMITTING PHYSICIAN: Milagros Loll, MD  DATE OF DISCHARGE: 11/01/2015  4:26 PM  PRIMARY CARE PHYSICIAN: BLISS, Doreene Nest, MD    ADMISSION DIAGNOSIS:  SOB (shortness of breath) [R06.02] Asthma exacerbation [J45.901]   DISCHARGE DIAGNOSIS:    SECONDARY DIAGNOSIS:   Past Medical History  Diagnosis Date  . Asthma     HOSPITAL COURSE:     Asthma exacerbation  Acute respiratory distress (HCC)  She has been treated with IV Solu-Medrol, Symbicort, Pulmicort, Spiriva, Nebs and levaquin. Per Dr. Mayo Ao, Check IGE, eosinophil count, Added singulair 10 mg q day.  Cough Treated with Robitussin, Tessalon and losenges.  Anxiety - Continue Xanax Her symptoms has much improved today. Still has cough. Physical examination didn't show wheezing. DISCHARGE CONDITIONS:   Stable, discharged to home today.  CONSULTS OBTAINED:  Treatment Team:  Marguarite Arbour, MD Mertie Moores, MD  DRUG ALLERGIES:   Allergies  Allergen Reactions  . Penicillins Swelling and Rash    Has patient had a PCN reaction causing immediate rash, facial/tongue/throat swelling, SOB or lightheadedness with hypotension: Yes Has patient had a PCN reaction causing severe rash involving mucus membranes or skin necrosis: No Has patient had a PCN reaction that required hospitalization No Has patient had a PCN reaction occurring within the last 10 years: No If all of the above answers are "NO", then may proceed with Cephalosporin use.  . Sulfa Antibiotics Swelling and Rash    DISCHARGE MEDICATIONS:   Discharge Medication List as of 11/01/2015 12:08 PM    START taking these medications   Details  budesonide-formoterol (SYMBICORT) 160-4.5 MCG/ACT inhaler Inhale 2 puffs into the lungs 2 (two) times daily., Starting 11/01/2015, Until  Discontinued, Print    levofloxacin (LEVAQUIN) 500 MG tablet Take 1 tablet (500 mg total) by mouth daily., Starting 11/01/2015, Until Discontinued, Print    montelukast (SINGULAIR) 10 MG tablet Take 1 tablet (10 mg total) by mouth at bedtime., Starting 11/01/2015, Until Discontinued, Print    predniSONE (DELTASONE) 5 MG tablet 40 mg po daily for 2 days, 20 mg po daily for 2 days, 10 mg po daily for 2 days., Print    tiotropium (SPIRIVA) 18 MCG inhalation capsule Place 1 capsule (18 mcg total) into inhaler and inhale daily., Starting 11/01/2015, Until Discontinued, Print    guaiFENesin-codeine 100-10 MG/5ML syrup Take 5 mLs by mouth every 4 (four) hours as needed for cough., Starting 11/01/2015, Until Discontinued, Print      CONTINUE these medications which have CHANGED   Details  albuterol (PROVENTIL HFA;VENTOLIN HFA) 108 (90 BASE) MCG/ACT inhaler Inhale 2 puffs into the lungs every 6 (six) hours as needed for wheezing or shortness of breath., Starting 11/01/2015, Until Discontinued, Print    ipratropium-albuterol (DUONEB) 0.5-2.5 (3) MG/3ML SOLN Take 3 mLs by nebulization 4 (four) times daily as needed (asthma.)., Starting 11/01/2015, Until Discontinued, Print      CONTINUE these medications which have NOT CHANGED   Details  medroxyPROGESTERone (PROVERA) 10 MG tablet Take 2 tablets (20 mg total) by mouth 3 (three) times daily., Starting 06/07/2015, Until Thu 06/12/16, Print    oxyCODONE-acetaminophen (PERCOCET) 7.5-325 MG per tablet Take 1 tablet by mouth every 4 (four) hours as needed for severe pain., Starting 06/07/2015, Until Discontinued, Print    promethazine (PHENERGAN) 12.5 MG tablet Take 1  tablet (12.5 mg total) by mouth every 6 (six) hours as needed for nausea or vomiting., Starting 06/07/2015, Until Discontinued, Print      STOP taking these medications     doxycycline (VIBRAMYCIN) 50 MG capsule          DISCHARGE INSTRUCTIONS:    If you experience worsening of your  admission symptoms, develop shortness of breath, life threatening emergency, suicidal or homicidal thoughts you must seek medical attention immediately by calling 911 or calling your MD immediately  if symptoms less severe.  You Must read complete instructions/literature along with all the possible adverse reactions/side effects for all the Medicines you take and that have been prescribed to you. Take any new Medicines after you have completely understood and accept all the possible adverse reactions/side effects.   Please note  You were cared for by a hospitalist during your hospital stay. If you have any questions about your discharge medications or the care you received while you were in the hospital after you are discharged, you can call the unit and asked to speak with the hospitalist on call if the hospitalist that took care of you is not available. Once you are discharged, your primary care physician will handle any further medical issues. Please note that NO REFILLS for any discharge medications will be authorized once you are discharged, as it is imperative that you return to your primary care physician (or establish a relationship with a primary care physician if you do not have one) for your aftercare needs so that they can reassess your need for medications and monitor your lab values.    Today   SUBJECTIVE   Cough.   VITAL SIGNS:  Blood pressure 125/77, pulse 70, temperature 98.4 F (36.9 C), temperature source Oral, resp. rate 18, height  (1.549 m), weight 102.604 kg (226 lb 3.2 oz), last menstrual period 10/05/2015, SpO2 99 %.  I/O:   Intake/Output Summary (Last 24 hours) at 11/01/15 1745 Last data filed at 11/01/15 0803  Gross per 24 hour  Intake      0 ml  Output   2000 ml  Net  -2000 ml    PHYSICAL EXAMINATION:  GENERAL:  34 y.o.-year-old patient lying in the bed with no acute distress. Obese . EYES: Pupils equal, round, reactive to light and accommodation. No  scleral icterus. Extraocular muscles intact.  HEENT: Head atraumatic, normocephalic. Oropharynx and nasopharynx clear.  NECK:  Supple, no jugular venous distention. No thyroid enlargement, no tenderness.  LUNGS: Normal breath sounds bilaterally, no wheezing, rales,rhonchi or crepitation. No use of accessory muscles of respiration.  CARDIOVASCULAR: S1, S2 normal. No murmurs, rubs, or gallops.  ABDOMEN: Soft, non-tender, non-distended. Bowel sounds present. No organomegaly or mass.  EXTREMITIES: No pedal edema, cyanosis, or clubbing.  NEUROLOGIC: Cranial nerves II through XII are intact. Muscle strength 5/5 in all extremities. Sensation intact. Gait not checked.  PSYCHIATRIC: The patient is alert and oriented x 3.  SKIN: No obvious rash, lesion, or ulcer.   DATA REVIEW:   CBC  Recent Labs Lab 11/01/15 0415  WBC 14.2*  HGB 13.2  HCT 40.1  PLT 336    Chemistries   Recent Labs Lab 10/28/15 1220 10/29/15 0541 11/01/15 0415  NA 137 139  --   K 3.8 3.9  --   CL 109 109  --   CO2 22 22  --   GLUCOSE 97 149*  --   BUN 12 13  --   CREATININE 0.67 0.65  0.67  CALCIUM 8.8* 8.7*  --   AST 27  --   --   ALT 21  --   --   ALKPHOS 28*  --   --   BILITOT 0.5  --   --     Cardiac Enzymes No results for input(s): TROPONINI in the last 168 hours.  Microbiology Results  Results for orders placed or performed during the hospital encounter of 06/07/15  Wet prep, genital     Status: Abnormal   Collection Time: 06/07/15 11:20 PM  Result Value Ref Range Status   Yeast Wet Prep HPF POC NONE SEEN NONE SEEN Final   Trich, Wet Prep NONE SEEN NONE SEEN Final   Clue Cells Wet Prep HPF POC FEW (A) NONE SEEN Final   WBC, Wet Prep HPF POC MODERATE (A) NONE SEEN Final  Chlamydia/NGC rt PCR ([facility] only)     Status: None   Collection Time: 06/07/15 11:20 PM  Result Value Ref Range Status   Specimen source GC/Chlam VAGINA  Final   Chlamydia Tr NOT DETECTED NOT DETECTED Final   N gonorrhoeae NOT  DETECTED NOT DETECTED Final    Comment: (NOTE) 100  This methodology has not been evaluated in pregnant women or in 200  patients with a history of hysterectomy. 300 400  This methodology will not be performed on patients less than 1714  years of age.     RADIOLOGY:  Dg Chest 2 View  10/31/2015  CLINICAL DATA:  Follow-up asthma top, cough, congestion EXAM: CHEST  2 VIEW COMPARISON:  10/29/2015 FINDINGS: Cardiomediastinal silhouette is stable. No acute infiltrate or pleural effusion. No pulmonary edema. Bony thorax is unremarkable. IMPRESSION: No active cardiopulmonary disease. Electronically Signed   By: Natasha MeadLiviu  Pop M.D.   On: 10/31/2015 08:52      I discussed with Dr. Meredeth IdeFleming.  Management plans discussed with the patient, family and they are in agreement.  CODE STATUS:     Code Status Orders        Start     Ordered   10/28/15 1554  Full code   Continuous     10/28/15 1553      TOTAL TIME TAKING CARE OF THIS PATIENT: 38 minutes.    Shaune Pollackhen, Kathlene Yano M.D on 11/01/2015 at 5:45 PM  Between 7am to 6pm - Pager - [phone]  After 6pm go to [email or web address] - password [email or web address] [email or web address]  [facility]  Office  [phone]  CC: Primary care physician; Dortha KernBLISS, LAURA K, MD

## 2015-11-01 NOTE — Discharge Instructions (Signed)
Heart healthy diet. Activity as tolerated. Asthma, Adult Asthma is a condition of the lungs in which the airways tighten and narrow. Asthma can make it hard to breathe. Asthma cannot be cured, but medicine and lifestyle changes can help control it. Asthma may be started (triggered) by:  Animal skin flakes (dander).  Dust.  Cockroaches.  Pollen.  Mold.  Smoke.  Cleaning products.  Hair sprays or aerosol sprays.  Paint fumes or strong smells.  Cold air, weather changes, and winds.  Crying or laughing hard.  Stress.  Certain medicines or drugs.  Foods, such as dried fruit, potato chips, and sparkling grape juice.  Infections or conditions (colds, flu).  Exercise.  Certain medical conditions or diseases.  Exercise or tiring activities. HOME CARE   Take medicine as told by your doctor.  Use a peak flow meter as told by your doctor. A peak flow meter is a tool that measures how well the lungs are working.  Record and keep track of the peak flow meter's readings.  Understand and use the asthma action plan. An asthma action plan is a written plan for taking care of your asthma and treating your attacks.  To help prevent asthma attacks:  Do not smoke. Stay away from secondhand smoke.  Change your heating and air conditioning filter often.  Limit your use of fireplaces and wood stoves.  Get rid of pests (such as roaches and mice) and their droppings.  Throw away plants if you see mold on them.  Clean your floors. Dust regularly. Use cleaning products that do not smell.  Have someone vacuum when you are not home. Use a vacuum cleaner with a HEPA filter if possible.  Replace carpet with wood, tile, or vinyl flooring. Carpet can trap animal skin flakes and dust.  Use allergy-proof pillows, mattress covers, and box spring covers.  Wash bed sheets and blankets every week in hot water and dry them in a dryer.  Use blankets that are made of polyester or  cotton.  Clean bathrooms and kitchens with bleach. If possible, have someone repaint the walls in these rooms with mold-resistant paint. Keep out of the rooms that are being cleaned and painted.  Wash hands often. GET HELP IF:  You have make a whistling sound when breaking (wheeze), have shortness of breath, or have a cough even if taking medicine to prevent attacks.  The colored mucus you cough up (sputum) is thicker than usual.  The colored mucus you cough up changes from clear or white to yellow, green, gray, or bloody.  You have problems from the medicine you are taking such as:  A rash.  Itching.  Swelling.  Trouble breathing.  You need reliever medicines more than 2-3 times a week.  Your peak flow measurement is still at 50-79% of your personal best after following the action plan for 1 hour.  You have a fever. GET HELP RIGHT AWAY IF:   You seem to be worse and are not responding to medicine during an asthma attack.  You are short of breath even at rest.  You get short of breath when doing very little activity.  You have trouble eating, drinking, or talking.  You have chest pain.  You have a fast heartbeat.  Your lips or fingernails start to turn blue.  You are light-headed, dizzy, or faint.  Your peak flow is less than 50% of your personal best.   This information is not intended to replace advice given to you by  your health care provider. Make sure you discuss any questions you have with your health care provider.   Document Released: 05/19/2008 Document Revised: 08/22/2015 Document Reviewed: 06/30/2013 Elsevier Interactive Patient Education [employer]

## 2015-11-01 NOTE — Discharge Planning (Signed)
Pt IV removed.  Pt discharge papers given, explained and educated.  Told of suggested FU appts.  Given scripts.  VSS and RN assessment revealed stability for DC to home.  Pt walked to front and is driving self home.

## 2015-11-02 LAB — IGE: IgE (Immunoglobulin E), Serum: 250 IU/mL — ABNORMAL HIGH (ref 0–100)

## 2015-12-30 ENCOUNTER — Encounter: Payer: Self-pay | Admitting: Emergency Medicine

## 2015-12-30 ENCOUNTER — Emergency Department
Admission: EM | Admit: 2015-12-30 | Discharge: 2015-12-30 | Disposition: A | Discharge status: Home / Self Care | Payer: Medicaid Other | Attending: Emergency Medicine | Admitting: Emergency Medicine

## 2015-12-30 DIAGNOSIS — M79671 Pain in right foot: Secondary | ICD-10-CM | POA: Diagnosis present

## 2015-12-30 DIAGNOSIS — M722 Plantar fascial fibromatosis: Secondary | ICD-10-CM | POA: Insufficient documentation

## 2015-12-30 DIAGNOSIS — R2689 Other abnormalities of gait and mobility: Secondary | ICD-10-CM | POA: Insufficient documentation

## 2015-12-30 DIAGNOSIS — Z792 Long term (current) use of antibiotics: Secondary | ICD-10-CM | POA: Diagnosis not present

## 2015-12-30 DIAGNOSIS — Z880 Allergy status to penicillin: Secondary | ICD-10-CM | POA: Insufficient documentation

## 2015-12-30 DIAGNOSIS — Z7952 Long term (current) use of systemic steroids: Secondary | ICD-10-CM | POA: Insufficient documentation

## 2015-12-30 DIAGNOSIS — Z79899 Other long term (current) drug therapy: Secondary | ICD-10-CM | POA: Diagnosis not present

## 2015-12-30 MED ORDER — ETODOLAC 500 MG PO TABS: 500.0000 mg | ORAL_TABLET | Freq: Two times a day (BID) | ORAL | Status: DC

## 2015-12-30 NOTE — ED Notes (Signed)
Right foot pain , no injury recalled , ambulating with a limp

## 2015-12-30 NOTE — ED Notes (Addendum)
Pt started having pain in right heel, states the pain feels like fish hook in her heel. Pt reports feeling a throbbing feeling. On weight bearing pt has been and after sleeping for 8 hours the pain is there in the morning.  Pt has history of foot surgery in both feet. Pt has taken Aleve 2 pills this morning with no relief. Pt has been working 12 shifts this week also on her feet a lot.

## 2015-12-30 NOTE — ED Provider Notes (Signed)
CSN: [id number]     Arrival date & time 12/30/15  1250 History   First MD Initiated Contact with Patient 12/30/15 1319     Chief Complaint  Patient presents with  . Foot Pain     (Consider location/radiation/quality/duration/timing/severity/associated sxs/prior Treatment) HPI  35 year old female presents for department for evaluation of right heel pain. She denies any trauma or injury. Pain began 5 days ago. She has worked numerous consecutive 12 hour shifts. Crit the sharp pain along the heel that is moderate, increased with weightbearing activities. Her pain is worse first thing in the morning. She has tried taking naproxen today, 2 tablets by mouth this morning with minimal relief. She has a neck 3 days off from work. She denies any warmth erythema drainage or fevers.  Past Medical History  Diagnosis Date  . Asthma    Past Surgical History  Procedure Laterality Date  . Cesarean section    . Leep    . Appendectomy     No family history on file. Social History  Substance Use Topics  . Smoking status: Never Smoker   . Smokeless tobacco: None  . Alcohol Use: Yes   OB History    No data available     Review of Systems  Constitutional: Negative for fever, chills, activity change and fatigue.  HENT: Negative for congestion, sinus pressure and sore throat.   Eyes: Negative for visual disturbance.  Respiratory: Negative for cough, chest tightness and shortness of breath.   Cardiovascular: Negative for chest pain and leg swelling.  Gastrointestinal: Negative for nausea, vomiting, abdominal pain and diarrhea.  Genitourinary: Negative for dysuria.  Musculoskeletal: Positive for arthralgias and gait problem.  Skin: Negative for rash and wound.  Neurological: Negative for weakness, numbness and headaches.  Hematological: Negative for adenopathy.  Psychiatric/Behavioral: Negative for behavioral problems, confusion and agitation.      Allergies  Penicillins and Sulfa  antibiotics  Home Medications   Prior to Admission medications   Medication Sig Start Date End Date Taking? Authorizing Provider  albuterol (PROVENTIL HFA;VENTOLIN HFA) 108 (90 BASE) MCG/ACT inhaler Inhale 2 puffs into the lungs every 6 (six) hours as needed for wheezing or shortness of breath. 11/01/15   Shaune PollackQing Chen, MD  benzonatate (TESSALON) 100 MG capsule Take 1 capsule (100 mg total) by mouth 3 (three) times daily as needed for cough. 11/01/15   Shaune PollackQing Chen, MD  budesonide-formoterol [facility](SYMBICORT) 160-4.5 MCG/ACT inhaler Inhale 2 puffs into the lungs 2 (two) times daily. 11/01/15   Shaune PollackQing Chen, MD  etodolac (LODINE) 500 MG tablet Take 1 tablet (500 mg total) by mouth 2 (two) times daily. 12/30/15   Evon Slackhomas C Deah Ottaway, PA-C  ipratropium-albuterol (DUONEB) 0.5-2.5 (3) MG/3ML SOLN Take 3 mLs by nebulization 4 (four) times daily as needed (asthma.). 11/01/15   Shaune PollackQing Chen, MD  levofloxacin (LEVAQUIN) 500 MG tablet Take 1 tablet (500 mg total) by mouth daily. 11/01/15   Shaune PollackQing Chen, MD  medroxyPROGESTERone (PROVERA) 10 MG tablet Take 2 tablets (20 mg total) by mouth 3 (three) times daily. 06/07/15 06/12/16  Darien Ramusavid W Kaminski, MD  montelukast (SINGULAIR) 10 MG tablet Take 1 tablet (10 mg total) by mouth at bedtime. 11/01/15   Shaune PollackQing Chen, MD  oxyCODONE-acetaminophen (PERCOCET) 7.5-325 MG per tablet Take 1 tablet by mouth every 4 (four) hours as needed for severe pain. 06/07/15   Darien Ramusavid W Kaminski, MD  predniSONE (DELTASONE) 5 MG tablet 40 mg po daily for 2 days, 20 mg po daily for 2 days,  10 mg po daily for 2 days. 11/01/15   Shaune Pollack, MD  promethazine (PHENERGAN) 12.5 MG tablet Take 1 tablet (12.5 mg total) by mouth every 6 (six) hours as needed for nausea or vomiting. 06/07/15   Darien Ramus, MD  tiotropium (SPIRIVA) 18 MCG inhalation capsule Place 1 capsule (18 mcg total) into inhaler and inhale daily. 11/01/15   Shaune Pollack, MD   BP 127/99 mmHg  Pulse 80  Temp(Src) 98.8 F (37.1 C) (Oral)  Resp 18  Ht 5\' 1"   (1.549 m)  Wt 90.719 kg  BMI 37.81 kg/m2  SpO2 97%  LMP 12/27/2015 Physical Exam  Constitutional: She is oriented to person, place, and time. She appears well-developed and well-nourished. No distress.  HENT:  Head: Normocephalic and atraumatic.  Mouth/Throat: Oropharynx is clear and moist.  Eyes: EOM are normal. Pupils are equal, round, and reactive to light. Right eye exhibits no discharge. Left eye exhibits no discharge.  Neck: Normal range of motion. Neck supple.  Cardiovascular: Normal rate and intact distal pulses.   Pulmonary/Chest: Effort normal. No respiratory distress.  Musculoskeletal:  Examination of the right foot shows patient has no swelling warmth erythema. No skin breakdown noted. She has full range of motion with ankle plantarflexion dorsiflexion. Increased pain with ankle dorsiflexion actively and passively. She is tender along the calcaneus along the plantar fascia. No ankle swelling or instability.  Neurological: She is alert and oriented to person, place, and time. She has normal reflexes.  Skin: Skin is warm and dry.  Psychiatric: She has a normal mood and affect. Her behavior is normal. Thought content normal.    ED Course  Procedures (including critical care time) Labs Review Labs Reviewed - No data to display  Imaging Review No results found. I have personally reviewed and evaluated these images and lab results as part of my medical decision-making.   EKG Interpretation None      MDM   Final diagnoses:  Plantar fasciitis of right foot    Patient with right foot plantar fasciitis. She is educated on diagnosis and treatment. She will follow-up with orthopedics if no improvement in 5-7 days. She will be started with a utility 500 mg 1 tab by mouth twice a day with meals. Ice the plantar fascia, she was shown stretching exercises. ER for any worsening symptoms urgent changes in her health.    Evon Slack, PA-C 12/30/15 1338  Governor Rooks,  MD 12/30/15 [phone]

## 2015-12-30 NOTE — Discharge Instructions (Signed)
Plantar Fasciitis Plantar fasciitis is a painful foot condition that affects the heel. It occurs when the band of tissue that connects the toes to the heel bone (plantar fascia) becomes irritated. This can happen after exercising too much or doing other repetitive activities (overuse injury). The pain from plantar fasciitis can range from mild irritation to severe pain that makes it difficult for you to walk or move. The pain is usually worse in the morning or after you have been sitting or lying down for a while. CAUSES This condition may be caused by:  Standing for long periods of time.  Wearing shoes that do not fit.  Doing high-impact activities, including running, aerobics, and ballet.  Being overweight.  Having an abnormal way of walking (gait).  Having tight calf muscles.  Having high arches in your feet.  Starting a new athletic activity. SYMPTOMS The main symptom of this condition is heel pain. Other symptoms include:  Pain that gets worse after activity or exercise.  Pain that is worse in the morning or after resting.  Pain that goes away after you walk for a few minutes. DIAGNOSIS This condition may be diagnosed based on your signs and symptoms. Your health care provider will also do a physical exam to check for:  A tender area on the bottom of your foot.  A high arch in your foot.  Pain when you move your foot.  Difficulty moving your foot. You may also need to have imaging studies to confirm the diagnosis. These can include:  X-rays.  Ultrasound.  MRI. TREATMENT  Treatment for plantar fasciitis depends on the severity of the condition. Your treatment may include:  Rest, ice, and over-the-counter pain medicines to manage your pain.  Exercises to stretch your calves and your plantar fascia.  A splint that holds your foot in a stretched, upward position while you sleep (night splint).  Physical therapy to relieve symptoms and prevent problems in the  future.  Cortisone injections to relieve severe pain.  Extracorporeal shock wave therapy (ESWT) to stimulate damaged plantar fascia with electrical impulses. It is often used as a last resort before surgery.  Surgery, if other treatments have not worked after 12 months. HOME CARE INSTRUCTIONS  Take medicines only as directed by your health care provider.  Avoid activities that cause pain.  Roll the bottom of your foot over a bag of ice or a bottle of cold water. Do this for 20 minutes, 3-4 times a day.  Perform simple stretches as directed by your health care provider.  Try wearing athletic shoes with air-sole or gel-sole cushions or soft shoe inserts.  Wear a night splint while sleeping, if directed by your health care provider.  Keep all follow-up appointments with your health care provider. PREVENTION   Do not perform exercises or activities that cause heel pain.  Consider finding low-impact activities if you continue to have problems.  Lose weight if you need to. The best way to prevent plantar fasciitis is to avoid the activities that aggravate your plantar fascia. SEEK MEDICAL CARE IF:  Your symptoms do not go away after treatment with home care measures.  Your pain gets worse.  Your pain affects your ability to move or do your daily activities.   This information is not intended to replace advice given to you by your health care provider. Make sure you discuss any questions you have with your health care provider.   Document Released: 08/26/2001 Document Revised: 08/22/2015 Document Reviewed: 10/11/2014 Elsevier   Interactive Patient Education 2016 [employer]  

## 2016-12-27 ENCOUNTER — Emergency Department
Admission: EM | Admit: 2016-12-27 | Discharge: 2016-12-27 | Disposition: A | Discharge status: Home / Self Care | Payer: Medicaid Other | Attending: Emergency Medicine | Admitting: Emergency Medicine

## 2016-12-27 ENCOUNTER — Encounter: Payer: Self-pay | Admitting: Emergency Medicine

## 2016-12-27 ENCOUNTER — Emergency Department: Payer: Medicaid Other

## 2016-12-27 DIAGNOSIS — Z79899 Other long term (current) drug therapy: Secondary | ICD-10-CM | POA: Diagnosis not present

## 2016-12-27 DIAGNOSIS — Y92009 Unspecified place in unspecified non-institutional (private) residence as the place of occurrence of the external cause: Secondary | ICD-10-CM | POA: Insufficient documentation

## 2016-12-27 DIAGNOSIS — S46811A Strain of other muscles, fascia and tendons at shoulder and upper arm level, right arm, initial encounter: Secondary | ICD-10-CM | POA: Insufficient documentation

## 2016-12-27 DIAGNOSIS — X58XXXA Exposure to other specified factors, initial encounter: Secondary | ICD-10-CM | POA: Insufficient documentation

## 2016-12-27 DIAGNOSIS — J45901 Unspecified asthma with (acute) exacerbation: Secondary | ICD-10-CM | POA: Diagnosis not present

## 2016-12-27 DIAGNOSIS — R0789 Other chest pain: Secondary | ICD-10-CM

## 2016-12-27 DIAGNOSIS — Y999 Unspecified external cause status: Secondary | ICD-10-CM | POA: Diagnosis not present

## 2016-12-27 DIAGNOSIS — Y9389 Activity, other specified: Secondary | ICD-10-CM | POA: Diagnosis not present

## 2016-12-27 DIAGNOSIS — S29012A Strain of muscle and tendon of back wall of thorax, initial encounter: Secondary | ICD-10-CM

## 2016-12-27 DIAGNOSIS — S4991XA Unspecified injury of right shoulder and upper arm, initial encounter: Secondary | ICD-10-CM | POA: Diagnosis present

## 2016-12-27 LAB — CBC
HCT: 43 % (ref 35.0–47.0)
Hemoglobin: 14.6 g/dL (ref 12.0–16.0)
MCH: 30.4 pg (ref 26.0–34.0)
MCHC: 34 g/dL (ref 32.0–36.0)
MCV: 89.4 fL (ref 80.0–100.0)
PLATELETS: 341 10*3/uL (ref 150–440)
RBC: 4.81 MIL/uL (ref 3.80–5.20)
RDW: 15.3 % — AB (ref 11.5–14.5)
WBC: 14.1 10*3/uL — AB (ref 3.6–11.0)

## 2016-12-27 LAB — BASIC METABOLIC PANEL
Anion gap: 7 (ref 5–15)
BUN: 9 mg/dL (ref 6–20)
CALCIUM: 8.8 mg/dL — AB (ref 8.9–10.3)
CHLORIDE: 108 mmol/L (ref 101–111)
CO2: 22 mmol/L (ref 22–32)
CREATININE: 0.71 mg/dL (ref 0.44–1.00)
Glucose, Bld: 100 mg/dL — ABNORMAL HIGH (ref 65–99)
Potassium: 4 mmol/L (ref 3.5–5.1)
SODIUM: 137 mmol/L (ref 135–145)

## 2016-12-27 LAB — TROPONIN I

## 2016-12-27 MED ORDER — DIAZEPAM 2 MG PO TABS
ORAL_TABLET | ORAL | Status: AC
  Filled 2016-12-27: qty 1

## 2016-12-27 MED ORDER — METHOCARBAMOL 500 MG PO TABS: 500.0000 mg | ORAL_TABLET | Freq: Three times a day (TID) | ORAL | 0 refills | Status: DC | PRN

## 2016-12-27 MED ORDER — KETOROLAC TROMETHAMINE 30 MG/ML IJ SOLN
15.0000 mg | INTRAMUSCULAR | Status: AC
  Administered 2016-12-27: 15 mg via INTRAVENOUS
  Filled 2016-12-27 (×2): qty 1

## 2016-12-27 MED ORDER — NAPROXEN 500 MG PO TABS: 500.0000 mg | ORAL_TABLET | Freq: Two times a day (BID) | ORAL | 0 refills | Status: DC

## 2016-12-27 MED ORDER — DIAZEPAM 2 MG PO TABS
2.0000 mg | ORAL_TABLET | Freq: Once | ORAL | Status: AC
  Administered 2016-12-27: 2 mg via ORAL
  Filled 2016-12-27: qty 1

## 2016-12-27 NOTE — ED Provider Notes (Signed)
[facility] Emergency Department Provider Note  ____________________________________________  Time seen: Approximately 7:24 PM  I have reviewed the triage vital signs and the nursing notes.   HISTORY  Chief Complaint Chest Pain    HPI Cassandra Cortez is a 36 y.o. female who complains of chest pain that started yesterday. She reports it is primarily in her back on the right side. No aggravating or alleviating factors. It's intermittent lasting 30-40 seconds at a time. No shortness of breath diaphoresis vomiting radiation dizziness or syncope. She denies any strenuous activity that may have precipitated it, but has been recently doing more housework and putting away Christmas decorations which seem related to the onset of the pain. Denies cough or wheezing.     Past Medical History:  Diagnosis Date  . Asthma      Patient Active Problem List   Diagnosis Date Noted  . Asthma exacerbation 10/28/2015  . Acute respiratory distress 10/28/2015  . Asthma 10/28/2015     Past Surgical History:  Procedure Laterality Date  . APPENDECTOMY    . CESAREAN SECTION    . LEEP       Prior to Admission medications   Medication Sig Start Date End Date Taking? Authorizing Provider  budesonide-formoterol (SYMBICORT) 160-4.5 MCG/ACT inhaler Inhale 2 puffs into the lungs 2 (two) times daily. Patient not taking: Reported on 12/27/2016 11/01/15   Shaune PollackQing Chen, MD  etodolac (LODINE) 500 MG tablet Take 1 tablet (500 mg total) by mouth 2 (two) times daily. Patient not taking: Reported on 12/27/2016 12/30/15   Evon Slackhomas C Gaines, PA-C  ipratropium-albuterol (DUONEB) 0.5-2.5 (3) MG/3ML SOLN Take 3 mLs by nebulization 4 (four) times daily as needed (asthma.). Patient not taking: Reported on 12/27/2016 11/01/15   Shaune PollackQing Chen, MD  levofloxacin (LEVAQUIN) 500 MG tablet Take 1 tablet (500 mg total) by mouth daily. Patient not taking: Reported on 12/27/2016 11/01/15   Shaune PollackQing Chen, MD   medroxyPROGESTERone (PROVERA) 10 MG tablet Take 2 tablets (20 mg total) by mouth 3 (three) times daily. 06/07/15 06/12/16  Darien Ramusavid W Kaminski, MD  methocarbamol (ROBAXIN) 500 MG tablet Take 1 tablet (500 mg total) by mouth every 8 (eight) hours as needed for muscle spasms. 12/27/16   Sharman CheekPhillip Zaire Vanbuskirk, MD  montelukast (SINGULAIR) 10 MG tablet Take 1 tablet (10 mg total) by mouth at bedtime. Patient not taking: Reported on 12/27/2016 11/01/15   Shaune PollackQing Chen, MD  naproxen (NAPROSYN) 500 MG tablet Take 1 tablet (500 mg total) by mouth 2 (two) times daily with a meal. 12/27/16   Sharman CheekPhillip Shelah Heatley, MD  oxyCODONE-acetaminophen (PERCOCET) 7.5-325 MG per tablet Take 1 tablet by mouth every 4 (four) hours as needed for severe pain. Patient not taking: Reported on 12/27/2016 06/07/15   Darien Ramusavid W Kaminski, MD  predniSONE (DELTASONE) 5 MG tablet 40 mg po daily for 2 days, 20 mg po daily for 2 days, 10 mg po daily for 2 days. Patient not taking: Reported on 12/27/2016 11/01/15   Shaune PollackQing Chen, MD  promethazine (PHENERGAN) 12.5 MG tablet Take 1 tablet (12.5 mg total) by mouth every 6 (six) hours as needed for nausea or vomiting. Patient not taking: Reported on 12/27/2016 06/07/15   Darien Ramusavid W Kaminski, MD  tiotropium [facility](SPIRIVA) 18 MCG inhalation capsule Place 1 capsule (18 mcg total) into inhaler and inhale daily. Patient not taking: Reported on 12/27/2016 11/01/15   Shaune PollackQing Chen, MD     Allergies Penicillins and Sulfa antibiotics   History reviewed. No pertinent family history.  Social  History Social History  Substance Use Topics  . Smoking status: Never Smoker  . Smokeless tobacco: Never Used  . Alcohol use Yes    Review of Systems  Constitutional:   No fever or chills.  ENT:   No sore throat. No rhinorrhea. Cardiovascular:   Positive as above posterior chest wall pain. Respiratory:   No dyspnea or cough. Gastrointestinal:   Negative for abdominal pain, vomiting and diarrhea.  Genitourinary:   Negative for dysuria or  difficulty urinating. Musculoskeletal:   Negative for focal pain or swelling Neurological:   Negative for headaches 10-point ROS otherwise negative.  ____________________________________________   PHYSICAL EXAM:  VITAL SIGNS: ED Triage Vitals [12/27/16 1634]  Enc Vitals Group     BP 107/72     Pulse Rate 82     Resp 20     Temp 98.3 F (36.8 C)     Temp Source Oral     SpO2 98 %     Weight 229 lb (103.9 kg)     Height 5\' 1"  (1.549 m)     Head Circumference      Peak Flow      Pain Score 8     Pain Loc      Pain Edu?      Excl. in GC?     Vital signs reviewed, nursing assessments reviewed.   Constitutional:   Alert and oriented. Well appearing and in no distress.Anxious and tearful Eyes:   No scleral icterus. No conjunctival pallor. PERRL. EOMI.  No nystagmus. ENT   Head:   Normocephalic and atraumatic.   Nose:   No congestion/rhinnorhea. No septal hematoma   Mouth/Throat:   MMM, no pharyngeal erythema. No peritonsillar mass.    Neck:   No stridor. No SubQ emphysema. No meningismus. Hematological/Lymphatic/Immunilogical:   No cervical lymphadenopathy. Cardiovascular:   RRR. Symmetric bilateral radial and DP pulses.  No murmurs.  Respiratory:   Normal respiratory effort without tachypnea nor retractions. Breath sounds are clear and equal bilaterally. No wheezes/rales/rhonchi. Posterior chest wall very tender to the touch in the area of the right rhomboids. This is tense and reproduces her pain. I have her adduct the right arm across the chest to expose the rhomboids and subscapular tissues more, it feels tight and worsens her symptoms. Gastrointestinal:   Soft and nontender. Non distended. There is no CVA tenderness.  No rebound, rigidity, or guarding. Genitourinary:   deferred Musculoskeletal:   Nontender with normal range of motion in all extremities. No joint effusions.  No lower extremity tenderness.  No edema. Neurologic:   Normal speech and language.   CN 2-10 normal. Motor grossly intact. No gross focal neurologic deficits are appreciated.  Skin:    Skin is warm, dry and intact. No rash noted.  No petechiae, purpura, or bullae.  ____________________________________________    LABS (pertinent positives/negatives) (all labs ordered are listed, but only abnormal results are displayed) Labs Reviewed  BASIC METABOLIC PANEL - Abnormal; Notable for the following:       Result Value   Glucose, Bld 100 (*)    Calcium 8.8 (*)    All other components within normal limits  CBC - Abnormal; Notable for the following:    WBC 14.1 (*)    RDW 15.3 (*)    All other components within normal limits  TROPONIN I   ____________________________________________   EKG  Interpreted by me  Date: 12/27/2016  Rate: 98  Rhythm: normal sinus rhythm  QRS Axis: normal  Intervals: normal  ST/T Wave abnormalities: normal  Conduction Disutrbances: none  Narrative Interpretation: unremarkable      ____________________________________________    RADIOLOGY  Chest x-ray unremarkable  ____________________________________________   PROCEDURES Procedures  ____________________________________________   INITIAL IMPRESSION / ASSESSMENT AND PLAN / ED COURSE  Pertinent labs & imaging results that were available during my care of the patient were reviewed by me and considered in my medical decision making (see chart for details).  Patient presents with atypical chest pain, exam reveals right-sided rhomboid muscle spasm and tenderness which exactly reproduces her pain.Considering the patient's symptoms, medical history, and physical examination today, I have low suspicion for ACS, PE, TAD, pneumothorax, carditis, mediastinitis, pneumonia, CHF, or sepsis.  We'll treat with NSAIDs, muscle relaxants, follow-up with primary care.     Clinical Course    ____________________________________________   FINAL CLINICAL IMPRESSION(S) / ED  DIAGNOSES  Final diagnoses:  Chest wall pain  Rhomboid muscle strain, initial encounter      New Prescriptions   METHOCARBAMOL (ROBAXIN) 500 MG TABLET    Take 1 tablet (500 mg total) by mouth every 8 (eight) hours as needed for muscle spasms.   NAPROXEN (NAPROSYN) 500 MG TABLET    Take 1 tablet (500 mg total) by mouth 2 (two) times daily with a meal.     Portions of this note were generated with dragon dictation software. Dictation errors may occur despite best attempts at proofreading.    Sharman Cheek, MD 12/27/16 1929

## 2016-12-27 NOTE — ED Triage Notes (Signed)
Pt reports chest pain that started yesterday, pt states she has been doing some housework and putting christmas decorations away when she started having right side chest pain. Pt also reports nausea and shortness of breath with the chest pain. Pt is tearful in triage and states the pain has remained the same since last night.

## 2017-10-18 ENCOUNTER — Emergency Department: Payer: Medicaid Other

## 2017-10-18 ENCOUNTER — Encounter: Payer: Self-pay | Admitting: Emergency Medicine

## 2017-10-18 ENCOUNTER — Observation Stay
Admission: EM | Admit: 2017-10-18 | Discharge: 2017-10-22 | Disposition: A | Discharge status: Home / Self Care | Payer: Medicaid Other | Attending: Internal Medicine | Admitting: Internal Medicine

## 2017-10-18 DIAGNOSIS — Z882 Allergy status to sulfonamides status: Secondary | ICD-10-CM | POA: Insufficient documentation

## 2017-10-18 DIAGNOSIS — R51 Headache: Secondary | ICD-10-CM | POA: Diagnosis not present

## 2017-10-18 DIAGNOSIS — J4542 Moderate persistent asthma with status asthmaticus: Secondary | ICD-10-CM | POA: Diagnosis present

## 2017-10-18 DIAGNOSIS — M6281 Muscle weakness (generalized): Secondary | ICD-10-CM | POA: Diagnosis not present

## 2017-10-18 DIAGNOSIS — B0089 Other herpesviral infection: Secondary | ICD-10-CM | POA: Diagnosis not present

## 2017-10-18 DIAGNOSIS — Z880 Allergy status to penicillin: Secondary | ICD-10-CM | POA: Diagnosis not present

## 2017-10-18 DIAGNOSIS — F419 Anxiety disorder, unspecified: Secondary | ICD-10-CM | POA: Diagnosis not present

## 2017-10-18 DIAGNOSIS — Z79899 Other long term (current) drug therapy: Secondary | ICD-10-CM | POA: Diagnosis not present

## 2017-10-18 DIAGNOSIS — J45901 Unspecified asthma with (acute) exacerbation: Secondary | ICD-10-CM | POA: Diagnosis present

## 2017-10-18 DIAGNOSIS — Z881 Allergy status to other antibiotic agents status: Secondary | ICD-10-CM | POA: Insufficient documentation

## 2017-10-18 DIAGNOSIS — Z6841 Body Mass Index (BMI) 40.0 and over, adult: Secondary | ICD-10-CM | POA: Insufficient documentation

## 2017-10-18 LAB — BASIC METABOLIC PANEL
ANION GAP: 11 (ref 5–15)
BUN: 10 mg/dL (ref 6–20)
CALCIUM: 8.8 mg/dL — AB (ref 8.9–10.3)
CO2: 22 mmol/L (ref 22–32)
CREATININE: 0.84 mg/dL (ref 0.44–1.00)
Chloride: 106 mmol/L (ref 101–111)
GFR calc Af Amer: >60 mL/min (ref >60)
GLUCOSE: 125 mg/dL — AB (ref 65–99)
Potassium: 3.5 mmol/L (ref 3.5–5.1)
Sodium: 139 mmol/L (ref 135–145)

## 2017-10-18 LAB — CBC
HCT: 41.8 % (ref 35.0–47.0)
Hemoglobin: 13.9 g/dL (ref 12.0–16.0)
MCH: 29.4 pg (ref 26.0–34.0)
MCHC: 33.2 g/dL (ref 32.0–36.0)
MCV: 88.6 fL (ref 80.0–100.0)
PLATELETS: 338 10*3/uL (ref 150–440)
RBC: 4.72 MIL/uL (ref 3.80–5.20)
RDW: 14 % (ref 11.5–14.5)
WBC: 15.2 10*3/uL — ABNORMAL HIGH (ref 3.6–11.0)

## 2017-10-18 MED ORDER — HYDROCOD POLST-CPM POLST ER 10-8 MG/5ML PO SUER
5.0000 mL | Freq: Once | ORAL | Status: AC
  Administered 2017-10-18: 5 mL via ORAL
  Filled 2017-10-18: qty 5

## 2017-10-18 MED ORDER — MAGNESIUM SULFATE 2 GM/50ML IV SOLN
2.0000 g | Freq: Once | INTRAVENOUS | Status: AC
  Administered 2017-10-18: 2 g via INTRAVENOUS
  Filled 2017-10-18: qty 50

## 2017-10-18 MED ORDER — METHYLPREDNISOLONE SODIUM SUCC 125 MG IJ SOLR
125.0000 mg | INTRAMUSCULAR | Status: AC
  Administered 2017-10-18: 125 mg via INTRAVENOUS
  Filled 2017-10-18: qty 2

## 2017-10-18 MED ORDER — ACETAMINOPHEN 500 MG PO TABS
1000.0000 mg | ORAL_TABLET | ORAL | Status: AC
  Administered 2017-10-18: 1000 mg via ORAL
  Filled 2017-10-18: qty 2

## 2017-10-18 MED ORDER — ALBUTEROL SULFATE (2.5 MG/3ML) 0.083% IN NEBU
5.0000 mg | INHALATION_SOLUTION | Freq: Once | RESPIRATORY_TRACT | Status: AC
  Administered 2017-10-18: 5 mg via RESPIRATORY_TRACT
  Filled 2017-10-18: qty 6

## 2017-10-18 MED ORDER — AZITHROMYCIN 500 MG PO TABS
500.0000 mg | ORAL_TABLET | Freq: Once | ORAL | Status: AC
  Administered 2017-10-18: 500 mg via ORAL
  Filled 2017-10-18: qty 1

## 2017-10-18 MED ORDER — IPRATROPIUM-ALBUTEROL 0.5-2.5 (3) MG/3ML IN SOLN
3.0000 mL | Freq: Once | RESPIRATORY_TRACT | Status: AC
  Administered 2017-10-19: 3 mL via RESPIRATORY_TRACT
  Filled 2017-10-18: qty 6

## 2017-10-18 NOTE — ED Triage Notes (Signed)
Pt comes into the ED via POV c/o shortness of breath, cough, and asthma.  Patient has audible wheezing and dry strong cough.  Patient tachycardic.  Patient has taken 4 albuterol treatments at home with no relief.  Patient has labored breathing at this time.

## 2017-10-18 NOTE — ED Provider Notes (Signed)
[facility] Emergency Department Provider Note   ____________________________________________   First MD Initiated Contact with Patient 10/18/17 2312     (approximate)  I have reviewed the triage vital signs and the nursing notes.   HISTORY  Chief Complaint Asthma; Shortness of Breath; and Cough    HPI Cassandra Cortez is a 36 y.o. female history of asthma    Patient reports that she has been under significant stressors in life to the death of her father about 1 week ago.  She began experiencing a cough with mild asthma symptoms over the last week, she reports a cough and symptoms have worsened and she is now experiencing significant "asthma".  She reports wheezing and taking multiple nebulizer treatments at home including for today with no relief.  She also had a fever at home to 100 and Fahrenheit, and reports a nonproductive cough.  She does report that when she coughs hard she has pain in the ribs primarily over the left side.  Denies any history of blood clots.  No trips or travels.  Denies chest pain except for in the ribs with coughing.  No leg swelling.  No long trips or travel.  She does report a runny nose and nasal congestion which started several days ago.  Patient does report she is a non-smoker, she used to smoke but she did smoke some marijuana over the last week due to the death of her father.  Currently on her menstrual cycle, previous tubal ligation.  Past Medical History:  Diagnosis Date  . Asthma     Patient Active Problem List   Diagnosis Date Noted  . Asthma exacerbation 10/28/2015  . Acute respiratory distress 10/28/2015  . Asthma 10/28/2015    Past Surgical History:  Procedure Laterality Date  . APPENDECTOMY    . CESAREAN SECTION    . LEEP      Prior to Admission medications   Medication Sig Start Date End Date Taking? Authorizing Provider  budesonide-formoterol (SYMBICORT) 160-4.5 MCG/ACT inhaler Inhale 2 puffs into  the lungs 2 (two) times daily. Patient not taking: Reported on 12/27/2016 11/01/15   Shaune Pollack, MD  etodolac (LODINE) 500 MG tablet Take 1 tablet (500 mg total) by mouth 2 (two) times daily. Patient not taking: Reported on 12/27/2016 12/30/15   Evon Slack, PA-C  ipratropium-albuterol (DUONEB) 0.5-2.5 (3) MG/3ML SOLN Take 3 mLs by nebulization 4 (four) times daily as needed (asthma.). Patient not taking: Reported on 12/27/2016 11/01/15   Shaune Pollack, MD   Take 1 tablet (500 mg total) by mouth daily. Patient not taking: Reported on 12/27/2016 11/01/15   Shaune Pollack, MD  medroxyPROGESTERone (PROVERA) 10 MG tablet Take 2 tablets (20 mg total) by mouth 3 (three) times daily. 06/07/15 06/12/16  Darien Ramus, MD  methocarbamol (ROBAXIN) 500 MG tablet Take 1 tablet (500 mg total) by mouth every 8 (eight) hours as needed for muscle spasms. 12/27/16   Sharman Cheek, MD  montelukast (SINGULAIR) 10 MG tablet Take 1 tablet (10 mg total) by mouth at bedtime. Patient not taking: Reported on 12/27/2016 11/01/15   Shaune Pollack, MD  naproxen (NAPROSYN) 500 MG tablet Take 1 tablet (500 mg total) by mouth 2 (two) times daily with a meal. 12/27/16   Sharman Cheek, MD    06/07/15   Darien Ramus, MD    11/01/15   Shaune Pollack, MD  promethazine (PHENERGAN) 12.5 MG tablet Take 1 tablet (12.5 mg total) by mouth every 6 (six)  hours as needed for nausea or vomiting. Patient not taking: Reported on 12/27/2016 06/07/15   Darien Ramus, MD  tiotropium [facility]) 18 MCG inhalation capsule Place 1 capsule (18 mcg total) into inhaler and inhale daily. Patient not taking: Reported on 12/27/2016 11/01/15   Shaune Pollack, MD    Allergies Penicillins and Sulfa antibiotics  No family history on file.  Social History Social History   Tobacco Use  . Smoking status: Never Smoker  . Smokeless tobacco: Never Used  Substance Use Topics  . Alcohol use: Yes  . Drug use: No    Review of Systems Constitutional: Fever  times Eyes: No visual changes. ENT: No sore throat. Cardiovascular: Denies chest pain. Respiratory: Mild shortness of breath, cough, see HPI Gastrointestinal: No abdominal pain.  No nausea, no vomiting.  No diarrhea.  No constipation. Genitourinary: Negative for dysuria. Musculoskeletal: Negative for back pain. Skin: Negative for rash except for chronic scarring from previous shingles in the right lower leg. Neurological: Negative for headaches, focal weakness or numbness.    ____________________________________________   PHYSICAL EXAM:  VITAL SIGNS: ED Triage Vitals  Enc Vitals Group     BP 10/18/17 2219 (!) 170/155     Pulse Rate 10/18/17 2216 (!) 120     Resp 10/18/17 2216 (!) 24     Temp 10/18/17 2216 99.1 F (37.3 C)     Temp Source 10/18/17 2216 Oral     SpO2 10/18/17 2216 96 %     Weight 10/18/17 2216 237 lb (107.5 kg)     Height 10/18/17 2216 5' 1.5" (1.562 m)     Head Circumference --      Peak Flow --      Pain Score 10/18/17 2215 7     Pain Loc --      Pain Edu? --      Excl. in GC? --     Constitutional: Alert and oriented.  Moderately ill-appearing, sitting upright, appears slightly dyspneic.  Slightly diaphoretic. Eyes: Conjunctivae are normal. Head: Atraumatic. Nose: No congestion/rhinnorhea. Mouth/Throat: Mucous membranes are moist. Neck: No stridor.   Cardiovascular: Mildly tachycardic rate, regular rhythm. Grossly normal heart sounds.  Good peripheral circulation. Respiratory: Mild increased work of breathing, mild use of accessory muscles, diffuse end expiratory wheezing heard throughout all lung fields.  No focal rales are noted.  She is able to speak in phrases  gastrointestinal: Soft and nontender. No distention. Musculoskeletal: No lower extremity tenderness nor edema. Neurologic:  Normal speech and language. No gross focal neurologic deficits are appreciated.  Skin:  Skin is warm, dry and intact. No rash noted. Psychiatric: Mood and affect are  normal. Speech and behavior are normal.  ____________________________________________   LABS (all labs ordered are listed, but only abnormal results are displayed)  Labs Reviewed  BASIC METABOLIC PANEL - Abnormal; Notable for the following components:      Result Value   Glucose, Bld 125 (*)    Calcium 8.8 (*)    All other components within normal limits  CBC - Abnormal; Notable for the following components:   WBC 15.2 (*)    All other components within normal limits   ____________________________________________  EKG  Reviewed and interpreted by me at 2300 Sinus tachycardia Ventricular rate 125 QRS 60 QTC 430 No evidence of ischemia noted ____________________________________________  RADIOLOGY  Dg Chest 2 View  Result Date: 10/18/2017 CLINICAL DATA:  Productive cough for a few days. Shortness of breath. History of asthma. EXAM: CHEST  2 VIEW  COMPARISON:  12/27/2016 FINDINGS: The heart size and mediastinal contours are within normal limits. Both lungs are clear. The visualized skeletal structures are unremarkable. IMPRESSION: No active cardiopulmonary disease. Electronically Signed   By: Burman NievesWilliam  Stevens M.D.   On: 10/18/2017 23:37   Chest x-ray reviewed, clear lungs  ____________________________________________   PROCEDURES  Procedure(s) performed: None  Procedures  Critical Care performed: Yes, see critical care note(s)   CRITICAL CARE Performed by: Sharyn CreamerQUALE, Emonee Winkowski   Total critical care time: 40 minutes  Critical care time was exclusive of separately billable procedures and treating other patients.  Critical care was necessary to treat or prevent imminent or life-threatening deterioration.  Critical care was time spent personally by me on the following activities: development of treatment plan with patient and/or surrogate as well as nursing, discussions with consultants, evaluation of patient's response to treatment, examination of patient, obtaining history  from patient or surrogate, ordering and performing treatments and interventions, ordering and review of laboratory studies, ordering and review of radiographic studies, pulse oximetry and re-evaluation of patient's condition.  Patient had multiple nebs, received IV magnesium and Solu-Medrol, and continues to have wheezing and increased work of breathing.  Based upon this I have ordered a continuous albuterol nebulizer.  Critical care due to concerns for respiratory system compromise including status asthmaticus ____________________________________________   INITIAL IMPRESSION / ASSESSMENT AND PLAN / ED COURSE  Pertinent labs & imaging results that were available during my care of the patient were reviewed by me and considered in my medical decision making (see chart for details).  Patient presents with what sounds like upper respiratory infection, likely viral starting several days ago after the death of her father.  She also has been recently smoking marijuana, and now presents with signs of likely asthma exacerbation with wheezing, mild increased work of breathing, but also noted to have elevated white count and a low-grade temperature suspicious for bronchitis or upper respiratory infection likely sparking and asthma exacerbation.  Does not complain of any chest complaints to raise concern for acute coronary syndrome and her EKG is reassuring.  Does not have any significant risk factors for pulmonary embolism, and does not complain of a persistent pleuritic pain but rather denotes pain in the left rib cage during episodes of coughing.  No abdominal symptoms.  Will start with traditional asthma treatments, monitor closely, given the extent of wheezing heard I did have an IV placed and will also treat her with magnesium for bronchospasm.     ----------------------------------------- 1:04 AM on 10/19/2017 -----------------------------------------  Despite previous treatments, patient continued  to have wheezing and speaking in not yet complete sentences.  Mild use of accessory muscles continuing, and diffuse end expiratory wheezing still heard.  I written for continuous albuterol at this time, she is awake and alert with no change in mental status and no hypoxia however I feel the patient will need admission at this time due to concerns of status asthmaticus. ____________________________________________   FINAL CLINICAL IMPRESSION(S) / ED DIAGNOSES  Final diagnoses:  Moderate persistent asthma with status asthmaticus      NEW MEDICATIONS STARTED DURING THIS VISIT:  This SmartLink is deprecated. Use AVSMEDLIST instead to display the medication list for a patient.   Note:  This document was prepared using Dragon voice recognition software and may include unintentional dictation errors.     Sharyn CreamerQuale, Landers Prajapati, MD 10/19/17 [phone]

## 2017-10-19 ENCOUNTER — Other Ambulatory Visit: Payer: Self-pay

## 2017-10-19 LAB — HEMOGLOBIN A1C
Hgb A1c MFr Bld: 5.2 % (ref 4.8–5.6)
Mean Plasma Glucose: 102.54 mg/dL

## 2017-10-19 LAB — TSH: TSH: 3.526 u[IU]/mL (ref 0.350–4.500)

## 2017-10-19 LAB — TROPONIN I: Troponin I: <0.03 ng/mL (ref <0.03)

## 2017-10-19 MED ORDER — PREDNISONE 20 MG PO TABS: 40.0000 mg | ORAL_TABLET | Freq: Every day | ORAL | Status: DC

## 2017-10-19 MED ORDER — ONDANSETRON HCL 4 MG/2ML IJ SOLN: 4.0000 mg | Freq: Four times a day (QID) | INTRAMUSCULAR | Status: DC | PRN

## 2017-10-19 MED ORDER — ALPRAZOLAM 0.25 MG PO TABS
0.2500 mg | ORAL_TABLET | Freq: Three times a day (TID) | ORAL | Status: DC | PRN
  Administered 2017-10-19 – 2017-10-22 (×6): 0.25 mg via ORAL
  Filled 2017-10-19 (×6): qty 1

## 2017-10-19 MED ORDER — CYCLOBENZAPRINE HCL 5 MG PO TABS
7.5000 mg | ORAL_TABLET | Freq: Three times a day (TID) | ORAL | Status: DC
  Administered 2017-10-19 – 2017-10-20 (×5): 7.5 mg via ORAL
  Filled 2017-10-19 (×5): qty 1.5

## 2017-10-19 MED ORDER — VALACYCLOVIR HCL 500 MG PO TABS
1000.0000 mg | ORAL_TABLET | Freq: Three times a day (TID) | ORAL | Status: DC
  Administered 2017-10-19 – 2017-10-22 (×11): 1000 mg via ORAL
  Filled 2017-10-19 (×13): qty 2

## 2017-10-19 MED ORDER — ACETAMINOPHEN 650 MG RE SUPP: 650.0000 mg | Freq: Four times a day (QID) | RECTAL | Status: DC | PRN

## 2017-10-19 MED ORDER — METHOCARBAMOL 500 MG PO TABS
500.0000 mg | ORAL_TABLET | Freq: Three times a day (TID) | ORAL | Status: DC | PRN
  Administered 2017-10-19: 500 mg via ORAL
  Filled 2017-10-19: qty 1

## 2017-10-19 MED ORDER — ALBUTEROL SULFATE (2.5 MG/3ML) 0.083% IN NEBU
2.5000 mg | INHALATION_SOLUTION | RESPIRATORY_TRACT | Status: AC
  Administered 2017-10-19 (×3): 2.5 mg via RESPIRATORY_TRACT
  Filled 2017-10-19 (×3): qty 3

## 2017-10-19 MED ORDER — ENOXAPARIN SODIUM 40 MG/0.4ML ~~LOC~~ SOLN
40.0000 mg | Freq: Two times a day (BID) | SUBCUTANEOUS | Status: DC
  Administered 2017-10-19 – 2017-10-22 (×7): 40 mg via SUBCUTANEOUS
  Filled 2017-10-19 (×7): qty 0.4

## 2017-10-19 MED ORDER — MOMETASONE FURO-FORMOTEROL FUM 200-5 MCG/ACT IN AERO
2.0000 | INHALATION_SPRAY | Freq: Two times a day (BID) | RESPIRATORY_TRACT | Status: DC
  Administered 2017-10-19 – 2017-10-22 (×6): 2 via RESPIRATORY_TRACT
  Filled 2017-10-19: qty 8.8

## 2017-10-19 MED ORDER — OXYCODONE HCL 5 MG PO TABS
5.0000 mg | ORAL_TABLET | Freq: Once | ORAL | Status: AC
  Administered 2017-10-19: 5 mg via ORAL
  Filled 2017-10-19: qty 1

## 2017-10-19 MED ORDER — ACETAMINOPHEN 325 MG PO TABS
650.0000 mg | ORAL_TABLET | Freq: Four times a day (QID) | ORAL | Status: DC | PRN
  Administered 2017-10-19: 650 mg via ORAL
  Filled 2017-10-19: qty 2

## 2017-10-19 MED ORDER — PREDNISONE 20 MG PO TABS: 30.0000 mg | ORAL_TABLET | Freq: Every day | ORAL | Status: DC

## 2017-10-19 MED ORDER — HYDROCOD POLST-CPM POLST ER 10-8 MG/5ML PO SUER
5.0000 mL | Freq: Two times a day (BID) | ORAL | Status: DC
  Administered 2017-10-19 – 2017-10-22 (×7): 5 mL via ORAL
  Filled 2017-10-19 (×7): qty 5

## 2017-10-19 MED ORDER — PREDNISONE 20 MG PO TABS: 20.0000 mg | ORAL_TABLET | Freq: Every day | ORAL | Status: DC

## 2017-10-19 MED ORDER — DOCUSATE SODIUM 100 MG PO CAPS
100.0000 mg | ORAL_CAPSULE | Freq: Two times a day (BID) | ORAL | Status: DC
  Administered 2017-10-19 – 2017-10-22 (×7): 100 mg via ORAL
  Filled 2017-10-19 (×7): qty 1

## 2017-10-19 MED ORDER — PREDNISONE 10 MG PO TABS: 5.0000 mg | ORAL_TABLET | Freq: Every day | ORAL | Status: DC

## 2017-10-19 MED ORDER — MONTELUKAST SODIUM 10 MG PO TABS
10.0000 mg | ORAL_TABLET | Freq: Every day | ORAL | Status: DC
  Administered 2017-10-19: 10 mg via ORAL
  Filled 2017-10-19: qty 1

## 2017-10-19 MED ORDER — MORPHINE SULFATE (PF) 4 MG/ML IV SOLN
4.0000 mg | Freq: Once | INTRAVENOUS | Status: AC
  Administered 2017-10-19: 4 mg via INTRAVENOUS
  Filled 2017-10-19: qty 1

## 2017-10-19 MED ORDER — METHYLPREDNISOLONE SODIUM SUCC 125 MG IJ SOLR
60.0000 mg | Freq: Two times a day (BID) | INTRAMUSCULAR | Status: DC
  Administered 2017-10-19 – 2017-10-22 (×6): 60 mg via INTRAVENOUS
  Filled 2017-10-19 (×6): qty 2

## 2017-10-19 MED ORDER — ALBUTEROL SULFATE (2.5 MG/3ML) 0.083% IN NEBU
2.5000 mg | INHALATION_SOLUTION | RESPIRATORY_TRACT | Status: AC
  Administered 2017-10-19: 2.5 mg via RESPIRATORY_TRACT
  Filled 2017-10-19: qty 3

## 2017-10-19 MED ORDER — PREDNISONE 50 MG PO TABS: 50.0000 mg | ORAL_TABLET | Freq: Every day | ORAL | Status: DC

## 2017-10-19 MED ORDER — LEVOFLOXACIN IN D5W 500 MG/100ML IV SOLN
500.0000 mg | INTRAVENOUS | Status: DC
  Administered 2017-10-19 – 2017-10-20 (×2): 500 mg via INTRAVENOUS
  Filled 2017-10-19 (×3): qty 100

## 2017-10-19 MED ORDER — BUTALBITAL-APAP-CAFFEINE 50-325-40 MG PO TABS
1.0000 | ORAL_TABLET | ORAL | Status: AC
  Administered 2017-10-19: 1 via ORAL
  Filled 2017-10-19: qty 1

## 2017-10-19 MED ORDER — PREDNISONE 10 MG PO TABS: 10.0000 mg | ORAL_TABLET | Freq: Every day | ORAL | Status: DC

## 2017-10-19 MED ORDER — BENZONATATE 100 MG PO CAPS
200.0000 mg | ORAL_CAPSULE | Freq: Three times a day (TID) | ORAL | Status: DC
  Administered 2017-10-19 – 2017-10-22 (×12): 200 mg via ORAL
  Filled 2017-10-19 (×12): qty 2

## 2017-10-19 MED ORDER — ALBUTEROL SULFATE (2.5 MG/3ML) 0.083% IN NEBU
7.5000 mg/h | INHALATION_SOLUTION | RESPIRATORY_TRACT | Status: AC
Start: 2017-10-19 — End: 2017-10-19
  Administered 2017-10-19: 7.5 mg/h via RESPIRATORY_TRACT
  Filled 2017-10-19: qty 24

## 2017-10-19 MED ORDER — IPRATROPIUM-ALBUTEROL 0.5-2.5 (3) MG/3ML IN SOLN
3.0000 mL | Freq: Four times a day (QID) | RESPIRATORY_TRACT | Status: DC | PRN
  Administered 2017-10-19 – 2017-10-21 (×7): 3 mL via RESPIRATORY_TRACT
  Filled 2017-10-19 (×7): qty 3

## 2017-10-19 MED ORDER — KETOROLAC TROMETHAMINE 30 MG/ML IJ SOLN
30.0000 mg | Freq: Four times a day (QID) | INTRAMUSCULAR | Status: DC | PRN
  Administered 2017-10-19 – 2017-10-22 (×7): 30 mg via INTRAVENOUS
  Filled 2017-10-19 (×8): qty 1

## 2017-10-19 MED ORDER — IBUPROFEN 400 MG PO TABS
400.0000 mg | ORAL_TABLET | Freq: Four times a day (QID) | ORAL | Status: DC | PRN
  Administered 2017-10-20 – 2017-10-22 (×4): 400 mg via ORAL
  Filled 2017-10-19 (×4): qty 1

## 2017-10-19 MED ORDER — ONDANSETRON HCL 4 MG PO TABS: 4.0000 mg | ORAL_TABLET | Freq: Four times a day (QID) | ORAL | Status: DC | PRN

## 2017-10-19 MED ORDER — METHYLPREDNISOLONE SODIUM SUCC 125 MG IJ SOLR: 60.0000 mg | Freq: Four times a day (QID) | INTRAMUSCULAR | Status: DC

## 2017-10-19 MED ORDER — NITROGLYCERIN 2 % TD OINT
0.5000 [in_us] | TOPICAL_OINTMENT | Freq: Once | TRANSDERMAL | Status: AC
  Administered 2017-10-19: 0.5 [in_us] via TOPICAL
  Filled 2017-10-19: qty 30

## 2017-10-19 NOTE — ED Notes (Signed)
Spoke with Dr. Sheryle Hailiamond regarding patient's pain and blood pressure, order to be placed in the computer.

## 2017-10-19 NOTE — Progress Notes (Signed)
[facility] - [place] at [facility]   PATIENT NAME: Cassandra Cortez    MR#:  [id number]  DATE OF BIRTH:  12/30/1980  SUBJECTIVE:  CHIEF COMPLAINT:   Chief Complaint  Patient presents with  . Asthma  . Shortness of Breath  . Cough   - Patient is emotionally upset at this time. Was placed on airborne isolation for possible zoster. -Continues to have upper airway wheeze when getting emotional  -also has dry cough.  REVIEW OF SYSTEMS:  Review of Systems  Constitutional: Positive for chills and malaise/fatigue. Negative for fever.  HENT: Negative for congestion, ear discharge, hearing loss and nosebleeds.   Eyes: Negative for blurred vision and double vision.  Respiratory: Positive for cough and shortness of breath. Negative for wheezing.   Cardiovascular: Positive for chest pain. Negative for palpitations and leg swelling.  Gastrointestinal: Negative for abdominal pain, constipation, diarrhea, nausea and vomiting.  Genitourinary: Negative for dysuria.  Skin: Positive for rash.  Neurological: Negative for dizziness, speech change, focal weakness, seizures and headaches.  Psychiatric/Behavioral: The patient is nervous/anxious.     DRUG ALLERGIES:   Allergies  Allergen Reactions  . Penicillins Swelling and Rash    Has patient had a PCN reaction causing immediate rash, facial/tongue/throat swelling, SOB or lightheadedness with hypotension: Yes Has patient had a PCN reaction causing severe rash involving mucus membranes or skin necrosis: No Has patient had a PCN reaction that required hospitalization No Has patient had a PCN reaction occurring within the last 10 years: No If all of the above answers are "NO", then may proceed with Cephalosporin use.  . Sulfa Antibiotics Swelling and Rash    VITALS:  Blood pressure 140/88, pulse 96, temperature 97.9 F (36.6 C), temperature source Oral, resp. rate (!) 24, height 5' 1.5" (1.562 m), weight 107.8 kg (237 lb 10.5  oz), last menstrual period 10/12/2017, SpO2 90 %.  PHYSICAL EXAMINATION:  Physical Exam  GENERAL:  36 y.o.-year-old anxious patient lying in the bed with no acute distress.  EYES: Pupils equal, round, reactive to light and accommodation. No scleral icterus. Extraocular muscles intact.  HEENT: Head atraumatic, normocephalic. Oropharynx and nasopharynx clear.  NECK:  Supple, no jugular venous distention. No thyroid enlargement, no tenderness.  LUNGS: Decreased breath sounds all over without no wheezing, rales,rhonchi or crepitation. Does have upper airway wheeze. No use of accessory muscles of respiration.  CARDIOVASCULAR: S1, S2 normal. No murmurs, rubs, or gallops.  ABDOMEN: Soft, nontender, nondistended. Bowel sounds present. No organomegaly or mass.  EXTREMITIES: No pedal edema, cyanosis, or clubbing.  NEUROLOGIC: Cranial nerves II through XII are intact. Muscle strength 5/5 in all extremities. Sensation intact. Gait not checked.  PSYCHIATRIC: The patient is alert and oriented x 3. Emotionally upset SKIN: Erythematous confluent rash noted on lower extremities anteriorly. No open blisters or lesions.   LABORATORY PANEL:   CBC Recent Labs  Lab 10/18/17 2219  WBC 15.2*  HGB 13.9  HCT 41.8  PLT 338   ------------------------------------------------------------------------------------------------------------------  Chemistries  Recent Labs  Lab 10/18/17 2219  NA 139  K 3.5  CL 106  CO2 22  GLUCOSE 125*  BUN 10  CREATININE 0.84  CALCIUM 8.8*   ------------------------------------------------------------------------------------------------------------------  Cardiac Enzymes Recent Labs  Lab 10/19/17 0623  TROPONINI <0.03   ------------------------------------------------------------------------------------------------------------------  RADIOLOGY:  Dg Chest 2 View  Result Date: 10/18/2017 CLINICAL DATA:  Productive cough for a few days. Shortness of breath. History  of asthma. EXAM: CHEST  2 VIEW COMPARISON:  12/27/2016  FINDINGS: The heart size and mediastinal contours are within normal limits. Both lungs are clear. The visualized skeletal structures are unremarkable. IMPRESSION: No active cardiopulmonary disease. Electronically Signed   By: Burman NievesWilliam  Stevens M.D.   On: 10/18/2017 23:37    EKG:   Orders placed or performed during the hospital encounter of 10/18/17  . ED EKG  . ED EKG    ASSESSMENT AND PLAN:   36 year old female with past medical history significant for obesity, asthma, headaches presents to hospital secondary to asthma exacerbation  #1 acute on chronic asthma exacerbation-still has some upper airway wheezing, started on IV Solu-Medrol -Added Levaquin for bronchitis -Continue inhalers and nebulizer treatments. On Singulair Efren cough medication and muscle relaxants added.  #2 chest pain-musculoskeletal chest pain from coughing. 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  #4 anxiety-emotionally upset due to recent stressful factors including father's demise. Add Xanax as needed while on steroids  #5 DVT prophylaxis-on Lovenox     All the records are reviewed and case discussed with Care Management/Social Workerr. Management plans discussed with the patient, family and they are in agreement.  CODE STATUS: Full code  TOTAL TIME TAKING CARE OF THIS PATIENT: 37 minutes.   POSSIBLE D/C IN 1-2 DAYS, DEPENDING ON CLINICAL CONDITION.   Enid BaasKALISETTI,Alaina Donati M.D on 10/19/2017 at 10:32 AM  Between 7am to 6pm - Pager - [phone]  After 6pm go to [email or web address] - password [email or web address]  [facility] [place] [facility]  Office  [phone]  CC: Primary care physician; Patient, No Pcp Per

## 2017-10-19 NOTE — H&P (Signed)
Cassandra Cortez is an 36 y.o. female.   Chief Complaint: Shortness of breath HPI: The patient with past medical history of asthma presents to the emergency department complaining of shortness of breath that has become progressively worse over the last week.  The patient states that she had a few episodes of wheezing that were relieved by her albuterol inhalers but that her cough and shortness of breath has worsened.  The cough is nonproductive.  She has some chest pain when she coughs.  In the emergency department her wheezing and air movement did not improve after multiple breathing treatments in addition to Solu-Medrol, thus the emergency department staff called the hospitalist service for admission.  Past Medical History:  Diagnosis Date  . Asthma     Past Surgical History:  Procedure Laterality Date  . APPENDECTOMY    . CESAREAN SECTION    . LEEP      Family History  Problem Relation Age of Onset  . Asthma Other    Social History:  reports that  has never smoked. she has never used smokeless tobacco. She reports that she drinks alcohol. She reports that she does not use drugs.  Allergies:  Allergies  Allergen Reactions  . Penicillins Swelling and Rash    Has patient had a PCN reaction causing immediate rash, facial/tongue/throat swelling, SOB or lightheadedness with hypotension: Yes Has patient had a PCN reaction causing severe rash involving mucus membranes or skin necrosis: No Has patient had a PCN reaction that required hospitalization No Has patient had a PCN reaction occurring within the last 10 years: No If all of the above answers are "NO", then may proceed with Cephalosporin use.  . Sulfa Antibiotics Swelling and Rash    Medications Prior to Admission  Medication Sig Dispense Refill  . albuterol (PROVENTIL HFA;VENTOLIN HFA) 108 (90 Base) MCG/ACT inhaler Inhale 1-2 puffs every 6 (six) hours as needed into the lungs for wheezing or shortness of breath.    Marland Kitchen albuterol  (PROVENTIL) (2.5 MG/3ML) 0.083% nebulizer solution Take 2.5 mg every 6 (six) hours as needed by nebulization for wheezing or shortness of breath.      Results for orders placed or performed during the hospital encounter of 10/18/17 (from the past 48 hour(s))  Basic metabolic panel     Status: Abnormal   Collection Time: 10/18/17 10:19 PM  Result Value Ref Range   Sodium 139 135 - 145 mmol/L   Potassium 3.5 3.5 - 5.1 mmol/L   Chloride 106 101 - 111 mmol/L   CO2 22 22 - 32 mmol/L   Glucose, Bld 125 (H) 65 - 99 mg/dL   BUN 10 6 - 20 mg/dL   Creatinine, Ser 0.84 0.44 - 1.00 mg/dL   Calcium 8.8 (L) 8.9 - 10.3 mg/dL   GFR calc non Af Amer >60 >60 mL/min   GFR calc Af Amer >60 >60 mL/min    Comment: (NOTE) The eGFR has been calculated using the CKD EPI equation. This calculation has not been validated in all clinical situations. eGFR's persistently <60 mL/min signify possible Chronic Kidney Disease.    Anion gap 11 5 - 15  CBC     Status: Abnormal   Collection Time: 10/18/17 10:19 PM  Result Value Ref Range   WBC 15.2 (H) 3.6 - 11.0 K/uL   RBC 4.72 3.80 - 5.20 MIL/uL   Hemoglobin 13.9 12.0 - 16.0 g/dL   HCT 41.8 35.0 - 47.0 %   MCV 88.6 80.0 - 100.0 fL  MCH 29.4 26.0 - 34.0 pg   MCHC 33.2 32.0 - 36.0 g/dL   RDW 14.0 11.5 - 14.5 %   Platelets 338 150 - 440 K/uL  TSH     Status: None   Collection Time: 10/18/17 10:19 PM  Result Value Ref Range   TSH 3.526 0.350 - 4.500 uIU/mL    Comment: Performed by a 3rd Generation assay with a functional sensitivity of <=0.01 uIU/mL.  Hemoglobin A1c     Status: None   Collection Time: 10/18/17 10:19 PM  Result Value Ref Range   Hgb A1c MFr Bld 5.2 4.8 - 5.6 %    Comment: (NOTE) Pre diabetes:          5.7%-6.4% Diabetes:              >6.4% Glycemic control for   <7.0% adults with diabetes    Mean Plasma Glucose 102.54 mg/dL    Comment: Performed at [place] [address] [place], [place] [address]  Troponin I     Status:  None   Collection Time: 10/19/17  6:23 AM  Result Value Ref Range   Troponin I <0.03 <0.03 ng/mL  Basic metabolic panel     Status: Abnormal   Collection Time: 10/20/17  5:55 AM  Result Value Ref Range   Sodium 134 (L) 135 - 145 mmol/L   Potassium 4.4 3.5 - 5.1 mmol/L   Chloride 106 101 - 111 mmol/L   CO2 21 (L) 22 - 32 mmol/L   Glucose, Bld 131 (H) 65 - 99 mg/dL   BUN 13 6 - 20 mg/dL   Creatinine, Ser 0.64 0.44 - 1.00 mg/dL   Calcium 8.9 8.9 - 10.3 mg/dL   GFR calc non Af Amer >60 >60 mL/min   GFR calc Af Amer >60 >60 mL/min    Comment: (NOTE) The eGFR has been calculated using the CKD EPI equation. This calculation has not been validated in all clinical situations. eGFR's persistently <60 mL/min signify possible Chronic Kidney Disease.    Anion gap 7 5 - 15   Dg Chest 2 View  Result Date: 10/18/2017 CLINICAL DATA:  Productive cough for a few days. Shortness of breath. History of asthma. EXAM: CHEST  2 VIEW COMPARISON:  12/27/2016 FINDINGS: The heart size and mediastinal contours are within normal limits. Both lungs are clear. The visualized skeletal structures are unremarkable. IMPRESSION: No active cardiopulmonary disease. Electronically Signed   By: Lucienne Capers M.D.   On: 10/18/2017 23:37    Review of Systems  Constitutional: Negative for chills and fever.  HENT: Negative for sore throat and tinnitus.   Eyes: Negative for blurred vision and redness.  Respiratory: Positive for cough, shortness of breath and wheezing.   Cardiovascular: Positive for chest pain. Negative for palpitations, orthopnea and PND.  Gastrointestinal: Negative for abdominal pain, diarrhea, nausea and vomiting.  Genitourinary: Negative for dysuria, frequency and urgency.  Musculoskeletal: Negative for joint pain and myalgias.  Skin: Negative for rash.       No lesions  Neurological: Positive for headaches. Negative for speech change, focal weakness and weakness.  Endo/Heme/Allergies: Does not  bruise/bleed easily.       No temperature intolerance  Psychiatric/Behavioral: Negative for depression and suicidal ideas.    Blood pressure (!) 140/98, pulse 98, temperature 98 F (36.7 C), temperature source Oral, resp. rate 18, height 5' 1.5" (1.562 m), weight 106.7 kg (235 lb 4.8 oz), last menstrual period 10/12/2017, SpO2 96 %. Physical Exam  Vitals reviewed. Constitutional: She is oriented to person, place, and time. She appears well-developed and well-nourished. No distress.  HENT:  Head: Normocephalic and atraumatic.  Mouth/Throat: Oropharynx is clear and moist.  Eyes: Conjunctivae and EOM are normal. Pupils are equal, round, and reactive to light. No scleral icterus.  Neck: Normal range of motion. Neck supple. No JVD present. No tracheal deviation present. No thyromegaly present.  Cardiovascular: Normal rate, regular rhythm and normal heart sounds. Exam reveals no gallop and no friction rub.  No murmur heard. Respiratory: Effort normal. She has wheezes.  GI: Soft. Bowel sounds are normal. She exhibits no distension. There is no tenderness.  Genitourinary:  Genitourinary Comments: Deferred  Musculoskeletal: Normal range of motion. She exhibits no edema.  Lymphadenopathy:    She has no cervical adenopathy.  Neurological: She is alert and oriented to person, place, and time. No cranial nerve deficit. She exhibits normal muscle tone.  Skin: Skin is warm and dry. No rash noted. No erythema.  Psychiatric: She has a normal mood and affect. Her behavior is normal. Judgment and thought content normal.     Assessment/Plan This is a 36 year old female admitted for asthma exacerbation. 1.  Asthma: Exacerbation; continue tachypnea and wheezing.  Continue breathing treatments.  Steroid taper.  Resume inhaled corticosteroid.  Trigger prevention with antihistamine and leukotriene inhibitor. 2.  Headache: Acute on chronic; Fioricet as needed 3.  Chest pain:  Atypical; reproducible.  Likely  from coughing.  Continue to monitor 4.  Morbid obesity: BMI 44; encouraged healthy diet and exercise. 5.  Herpetic outbreak: High-dose Valtrex 6.  DVT prophylaxis: Lovenox 7.  GI prophylaxis: None The patient is a full code.  Time spent on admission orders and patient care approximately 45 minutes  Harrie Foreman, MD 10/20/2017, 7:50 AM

## 2017-10-19 NOTE — ED Notes (Signed)
Pt transport to 160 

## 2017-10-19 NOTE — ED Notes (Signed)
Reporting headache.

## 2017-10-19 NOTE — Progress Notes (Signed)
lovenox changed to 40 mg BID for CrCl >30 and BMI >40. 

## 2017-10-20 ENCOUNTER — Encounter: Payer: Self-pay | Admitting: Internal Medicine

## 2017-10-20 LAB — BASIC METABOLIC PANEL
ANION GAP: 7 (ref 5–15)
BUN: 13 mg/dL (ref 6–20)
CHLORIDE: 106 mmol/L (ref 101–111)
CO2: 21 mmol/L — ABNORMAL LOW (ref 22–32)
Calcium: 8.9 mg/dL (ref 8.9–10.3)
Creatinine, Ser: 0.64 mg/dL (ref 0.44–1.00)
GFR calc Af Amer: >60 mL/min (ref >60)
Glucose, Bld: 131 mg/dL — ABNORMAL HIGH (ref 65–99)
POTASSIUM: 4.4 mmol/L (ref 3.5–5.1)
SODIUM: 134 mmol/L — AB (ref 135–145)

## 2017-10-20 LAB — INFLUENZA PANEL BY PCR (TYPE A & B)
INFLAPCR: NEGATIVE
INFLBPCR: NEGATIVE

## 2017-10-20 MED ORDER — AMLODIPINE BESYLATE 5 MG PO TABS
5.0000 mg | ORAL_TABLET | Freq: Every day | ORAL | Status: DC
  Administered 2017-10-20 – 2017-10-21 (×2): 5 mg via ORAL
  Filled 2017-10-20 (×2): qty 1

## 2017-10-20 MED ORDER — CYCLOBENZAPRINE HCL 10 MG PO TABS
10.0000 mg | ORAL_TABLET | Freq: Three times a day (TID) | ORAL | Status: AC
  Administered 2017-10-20: 10 mg via ORAL
  Filled 2017-10-20: qty 1

## 2017-10-20 MED ORDER — OXYCODONE HCL 5 MG PO TABS
5.0000 mg | ORAL_TABLET | Freq: Once | ORAL | Status: AC
  Administered 2017-10-20: 5 mg via ORAL
  Filled 2017-10-20: qty 1

## 2017-10-20 MED ORDER — TRAMADOL HCL 50 MG PO TABS
50.0000 mg | ORAL_TABLET | Freq: Two times a day (BID) | ORAL | Status: AC
  Administered 2017-10-20 – 2017-10-21 (×4): 50 mg via ORAL
  Filled 2017-10-20 (×4): qty 1

## 2017-10-20 NOTE — Progress Notes (Signed)
[employer] [facility] - [place] [facility]   PATIENT NAME: Cassandra Cortez    MR#:  [id number]  DATE OF BIRTH:  10/10/1981  SUBJECTIVE:  CHIEF COMPLAINT:   Chief Complaint  Patient presents with  . Asthma  . Shortness of Breath  . Cough   -Still has musculoskeletal chest pain on coughing and cough. Occasional wheezing present.  REVIEW OF SYSTEMS:  Review of Systems  Constitutional: Positive for malaise/fatigue. Negative for chills and fever.  HENT: Negative for congestion, ear discharge, hearing loss and nosebleeds.   Eyes: Negative for blurred vision and double vision.  Respiratory: Positive for cough and shortness of breath. Negative for wheezing.   Cardiovascular: Positive for chest pain. Negative for palpitations and leg swelling.  Gastrointestinal: Negative for abdominal pain, constipation, diarrhea, nausea and vomiting.  Genitourinary: Negative for dysuria.  Skin: Positive for rash.  Neurological: Negative for dizziness, speech change, focal weakness, seizures and headaches.  Psychiatric/Behavioral: The patient is not nervous/anxious.     DRUG ALLERGIES:   Allergies  Allergen Reactions  . Penicillins Swelling and Rash    Has patient had a PCN reaction causing immediate rash, facial/tongue/throat swelling, SOB or lightheadedness with hypotension: Yes Has patient had a PCN reaction causing severe rash involving mucus membranes or skin necrosis: No Has patient had a PCN reaction that required hospitalization No Has patient had a PCN reaction occurring within the last 10 years: No If all of the above answers are "NO", then may proceed with Cephalosporin use.  . Sulfa Antibiotics Swelling and Rash    VITALS:  Blood pressure (!) 136/100, pulse 88, temperature 97.6 F (36.4 C), temperature source Oral, resp. rate 20, height 5' 1.5" (1.562 m), weight 106.7 kg (235 lb 4.8 oz), last menstrual period 10/12/2017, SpO2 98 %.  PHYSICAL EXAMINATION:  Physical  Exam  GENERAL:  36 y.o.-year-old anxious patient lying in the bed with no acute distress.  EYES: Pupils equal, round, reactive to light and accommodation. No scleral icterus. Extraocular muscles intact.  HEENT: Head atraumatic, normocephalic. Oropharynx and nasopharynx clear.  NECK:  Supple, no jugular venous distention. No thyroid enlargement, no tenderness.  LUNGS: Decreased breath sounds all over without no wheezing, rales,rhonchi or crepitation. Does have upper airway wheeze. No use of accessory muscles of respiration.  CARDIOVASCULAR: S1, S2 normal. No murmurs, rubs, or gallops.  ABDOMEN: Soft, nontender, nondistended. Bowel sounds present. No organomegaly or mass.  EXTREMITIES: No pedal edema, cyanosis, or clubbing.  NEUROLOGIC: Cranial nerves II through XII are intact. Muscle strength 5/5 in all extremities. Sensation intact. Gait not checked.  PSYCHIATRIC: The patient is alert and oriented x 3.  SKIN: Erythematous confluent rash noted on lower extremities anteriorly. Appears more faded today. No open blisters or lesions.   LABORATORY PANEL:   CBC Recent Labs  Lab 10/18/17 2219  WBC 15.2*  HGB 13.9  HCT 41.8  PLT 338   ------------------------------------------------------------------------------------------------------------------  Chemistries  Recent Labs  Lab 10/20/17 0555  NA 134*  K 4.4  CL 106  CO2 21*  GLUCOSE 131*  BUN 13  CREATININE 0.64  CALCIUM 8.9   ------------------------------------------------------------------------------------------------------------------  Cardiac Enzymes Recent Labs  Lab 10/19/17 0623  TROPONINI <0.03   ------------------------------------------------------------------------------------------------------------------  RADIOLOGY:  Dg Chest 2 View  Result Date: 10/18/2017 CLINICAL DATA:  Productive cough for a few days. Shortness of breath. History of asthma. EXAM: CHEST  2 VIEW COMPARISON:  12/27/2016 FINDINGS: The heart  size and mediastinal contours are within normal limits. Both lungs are  clear. The visualized skeletal structures are unremarkable. IMPRESSION: No active cardiopulmonary disease. Electronically Signed   By: Burman Nieves M.D.   On: 10/18/2017 23:37    EKG:   Orders placed or performed during the hospital encounter of 10/18/17  . ED EKG  . ED EKG    ASSESSMENT AND PLAN:   36 year old female with past medical history significant for obesity, asthma, headaches presents to hospital secondary to asthma exacerbation  #1 acute on chronic asthma exacerbation-improved upper airway wheezing,  -  on IV Solu-Medrol -Added Levaquin for bronchitis -Continue inhalers and nebulizer treatments.  - cough medication and muscle relaxants added.  #2 chest pain-musculoskeletal chest pain from coughing.  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  #4 anxiety-emotionally upset due to recent stressful factors including father's demise. Added Xanax as needed while on steroids  #5 DVT prophylaxis-on Lovenox   Improving now- likely discharge tomorrow   All the records are reviewed and case discussed with Care Management/Social Workerr. Management plans discussed with the patient, family and they are in agreement.  CODE STATUS: Full code  TOTAL TIME TAKING CARE OF THIS PATIENT: 37 minutes.   POSSIBLE D/C TOMORROW, DEPENDING ON CLINICAL CONDITION.   Enid Baas M.D on 10/20/2017 at 2:43 PM  Between 7am to 6pm - Pager - [phone]  After 6pm go to [email or web address] - password [email or web address]  [facility] [place] [facility]  Office  [phone]  CC: Primary care physician; Patient, No Pcp Per

## 2017-10-20 NOTE — Plan of Care (Signed)
Patient's breathing will improve with additional meds.

## 2017-10-20 NOTE — Progress Notes (Signed)
Patient verbalizes pain 8/10. Not relieved by motrin. BP 136/100. [facility].  Received verbal order for Oxycodone 5 mg PO one time.

## 2017-10-21 MED ORDER — AMLODIPINE BESYLATE 10 MG PO TABS
10.0000 mg | ORAL_TABLET | Freq: Every day | ORAL | Status: DC
  Administered 2017-10-22: 10 mg via ORAL
  Filled 2017-10-21: qty 1

## 2017-10-21 MED ORDER — METOPROLOL TARTRATE 25 MG PO TABS: 25.0000 mg | ORAL_TABLET | Freq: Two times a day (BID) | ORAL | Status: DC

## 2017-10-21 MED ORDER — HYDRALAZINE HCL 20 MG/ML IJ SOLN
10.0000 mg | Freq: Once | INTRAMUSCULAR | Status: AC
  Administered 2017-10-21: 10 mg via INTRAVENOUS
  Filled 2017-10-21: qty 1

## 2017-10-21 MED ORDER — LEVOFLOXACIN 500 MG PO TABS
500.0000 mg | ORAL_TABLET | Freq: Every day | ORAL | Status: DC
  Administered 2017-10-21: 500 mg via ORAL
  Filled 2017-10-21: qty 1

## 2017-10-21 MED ORDER — AMLODIPINE BESYLATE 5 MG PO TABS
5.0000 mg | ORAL_TABLET | Freq: Once | ORAL | Status: AC
  Administered 2017-10-21: 5 mg via ORAL
  Filled 2017-10-21: qty 1

## 2017-10-21 NOTE — Evaluation (Signed)
Physical Therapy Evaluation Patient Details Name: Cassandra CheersDevon A Voller MRN: [id number] DOB: 1981/10/18 Today's Date: 10/21/2017   History of Present Illness  The patient with past medical history of asthma presents to the emergency department complaining of shortness of breath that has become progressively worse over the last week.  The patient states that she had a few episodes of wheezing that were relieved by her albuterol inhalers but that her cough and shortness of breath has worsened.  The cough is nonproductive.  She has some chest pain when she coughs.  In the emergency department her wheezing and air movement did not improve after multiple breathing treatments in addition to Solu-Medrol, thus the emergency department staff called the hospitalist service for admission. Pt currently admitted under observation status for acute on chronic asthma exacerbation, chest pain secondary to cough, and rash on bilateral LEs.   Clinical Impression  Pt admitted with above diagnosis. Pt currently with functional limitations due to the deficits listed below (see PT Problem List). No focal deficits in strength identified with MMT except for symmetrical loss of bilateral grip strength. She is modified independent with bed mobility and transfers with rolling walker. Pt is able to ambulate at EOB with small steps forward/backwards twice. She reports feeling unsteady and demonstrates bilateral LE buckling but is able to self correct without external assist from therapist. Buckling does not appears organic in nature and is not consistent with her seated LE strength testing. Pt is unable to ambulate farther at this time due to fatigue and LE buckling. Unclear of the etiology of her weakness given her PMH and admitting diagnosis. She does appear to be notably anxious regarding her health and recent passing of her father. Would not be unreasonable to consider a psychiatric consult if deemed appropriate by hospitalist. Otherwise  pt will need further workup for this apparent functional LE weakness. [facility]. She can sleep on the couch downstairs and utilize a BSC and rolling walker for toileting needs. Pt reports that she will have adequate support tomorrow when her "children's father" is back in town. Pt will benefit from PT services to address deficits in strength, balance, and mobility in order to return to full function at home.       Follow Up Recommendations Home health PT    Equipment Recommendations  None recommended by PT;Other (comment)(Pt reports she has access to rolling walker and BSC)    Recommendations for Other Services       Precautions / Restrictions Precautions Precautions: None Restrictions Weight Bearing Restrictions: No      Mobility  Bed Mobility Overal bed mobility: Modified Independent             General bed mobility comments: Fair speed and sequencing identified. HOB elevated and use of bed rails  Transfers Overall transfer level: Needs assistance Equipment used: Rolling walker (2 wheeled) Transfers: Sit to/from Stand Sit to Stand: Modified independent (Device/Increase time)         General transfer comment: Pt able to perform sit to stand transfers with UE support on bed. No cues required and no external assist. Increased time to come to standing and two attempts required. Once upright pt is able to stabilize in standing with UE support on rolling walker  Ambulation/Gait Ambulation/Gait assistance: Min guard Ambulation Distance (Feet): 12 Feet(6+6) Assistive device: Rolling walker (2 wheeled) Gait Pattern/deviations: Decreased step length - right;Decreased step length - left Gait velocity: Decreased Gait velocity interpretation: <1.8 ft/sec,  indicative of risk for recurrent falls General Gait Details: Pt able to ambulate at EOB with small steps forward/backwards twice. She reports feeling unsteady and demonstrates bilateral LE  buckling but is able to self correct without external assist from therapist. Buckling does not appears organic in nature and is not consistent with her seated LE strength testing. Pt is unable to ambulate farther at this time due to fatigue and LE buckling.   Stairs            Wheelchair Mobility    Modified Rankin (Stroke Patients Only)       Balance Overall balance assessment: Needs assistance Sitting-balance support: No upper extremity supported Sitting balance-Leahy Scale: Good     Standing balance support: No upper extremity supported Standing balance-Leahy Scale: Fair Standing balance comment: Able to maintain static standing balance for short bouts without UE support but prefers to support herself with bilateral UEs on walker                             Pertinent Vitals/Pain Pain Assessment: No/denies pain    Home Living Family/patient expects to be discharged to:: Private residence Living Arrangements: Children;Other (Comment)(Pt reports her childrens father will be in town tomorrow) Available Help at Discharge: Family Type of Home: Apartment Home Access: Stairs to enter Entrance Stairs-Rails: None Entrance Stairs-Number of Steps: 2 Home Layout: Two level;Bed/bath upstairs Home Equipment: Walker - 2 wheels;Bedside commode;Tub bench      Prior Function Level of Independence: Independent         Comments: Pt reports independence with ADLs/IADLs. No falls in the last 12 months. No assistive device for ambulation.      Hand Dominance   Dominant Hand: Right    Extremity/Trunk Assessment   Upper Extremity Assessment Upper Extremity Assessment: RUE deficits/detail RUE Deficits / Details: Shoulder and elbow strength appears grossly WFL. Grip strength is decreased bilaterally. Strength is symmetrical. Pt reports intact sensation to light touch    Lower Extremity Assessment Lower Extremity Assessment: RLE deficits/detail RLE Deficits /  Details: Bilateral LE strength testing is at least 4+ to 5/5 throughout without focal weakness identified. Reports intact sensation to light touch. Pt reports        Communication   Communication: No difficulties  Cognition Arousal/Alertness: Awake/alert Behavior During Therapy: Anxious Overall Cognitive Status: Within Functional Limits for tasks assessed                                 General Comments: Pt appears anxious during evaluation regarding discharge planning      General Comments      Exercises     Assessment/Plan    PT Assessment Patient needs continued PT services  PT Problem List Decreased strength;Decreased activity tolerance;Decreased balance;Decreased mobility       PT Treatment Interventions DME instruction;Gait training;Stair training;Functional mobility training;Therapeutic exercise;Therapeutic activities;Balance training;Neuromuscular re-education;Patient/family education    PT Goals (Current goals can be found in the Care Plan section)  Acute Rehab PT Goals Patient Stated Goal: Return to prior level of function at home PT Goal Formulation: With patient Time For Goal Achievement: 11/04/17 Potential to Achieve Goals: Good    Frequency Min 2X/week   Barriers to discharge Inaccessible home environment;Decreased caregiver support Pt reports that her mother is unable to provide physical assist. She has 3 children at home but they cannot assist her physically. Her childrens  father will be back in town tomorrow and he can "carry me up the stairs to my bedroom."     Co-evaluation               AM-PAC PT "6 Clicks" Daily Activity  Outcome Measure Difficulty turning over in bed (including adjusting bedclothes, sheets and blankets)?: None Difficulty moving from lying on back to sitting on the side of the bed? : None Difficulty sitting down on and standing up from a chair with arms (e.g., wheelchair, bedside commode, etc,.)?: A Little Help  needed moving to and from a bed to chair (including a wheelchair)?: A Little Help needed walking in hospital room?: A Little Help needed climbing 3-5 steps with a railing? : Total 6 Click Score: 18    End of Session Equipment Utilized During Treatment: Gait belt Activity Tolerance: Patient tolerated treatment well Patient left: in bed;with call bell/phone within reach;with bed alarm set Nurse Communication: Mobility status PT Visit Diagnosis: Unsteadiness on feet (R26.81);Muscle weakness (generalized) (M62.81)    Time: [id number] PT Time Calculation (min) (ACUTE ONLY): 28 min   Charges:   PT Evaluation $PT Eval Low Complexity: 1 Low PT Treatments $Therapeutic Activity: 8-22 mins   PT G Codes:   PT G-Codes **NOT FOR INPATIENT CLASS** Functional Assessment Tool Used: AM-PAC 6 Clicks Basic Mobility Functional Limitation: Mobility: Walking and moving around Mobility: Walking and Moving Around Current Status ([id number](G8978): At least 40 percent but less than 60 percent impaired, limited or restricted Mobility: Walking and Moving Around Goal Status [phone](G8979): At least 1 percent but less than 20 percent impaired, limited or restricted   Lynnea MaizesJason D Tytianna Greenley PT, DPT    Nylani Michetti 10/21/2017, 4:57 PM

## 2017-10-21 NOTE — Progress Notes (Signed)
[facility] [place] [facility]   PATIENT NAME: Cassandra PonsDevon Cortez    MR#:  [id number]  DATE OF BIRTH:  [birth year]  SUBJECTIVE:  CHIEF COMPLAINT:   Chief Complaint  Patient presents with  . Asthma  . Shortness of Breath  . Cough   - Patient feels very weak and unsteady. Very emotional and says that she cannot go home today  REVIEW OF SYSTEMS:  Review of Systems  Constitutional: Positive for malaise/fatigue. Negative for chills and fever.  HENT: Negative for congestion, ear discharge, hearing loss and nosebleeds.   Eyes: Negative for blurred vision and double vision.  Respiratory: Positive for cough and shortness of breath. Negative for wheezing.   Cardiovascular: Positive for chest pain. Negative for palpitations and leg swelling.  Gastrointestinal: Negative for abdominal pain, constipation, diarrhea, nausea and vomiting.  Genitourinary: Negative for dysuria.  Skin: Positive for rash.  Neurological: Negative for dizziness, speech change, focal weakness, seizures and headaches.  Psychiatric/Behavioral: The patient is not nervous/anxious.     DRUG ALLERGIES:   Allergies  Allergen Reactions  . Penicillins Swelling and Rash    Has patient had a PCN reaction causing immediate rash, facial/tongue/throat swelling, SOB or lightheadedness with hypotension: Yes Has patient had a PCN reaction causing severe rash involving mucus membranes or skin necrosis: No Has patient had a PCN reaction that required hospitalization No Has patient had a PCN reaction occurring within the last 10 years: No If all of the above answers are "NO", then may proceed with Cephalosporin use.  . Sulfa Antibiotics Swelling and Rash    VITALS:  Blood pressure (!) 157/118, pulse 81, temperature 97.8 F (36.6 C), temperature source Oral, resp. rate 18, height 5' 1.5" (1.562 m), weight 107.1 kg (236 lb 1.6 oz), last menstrual period 10/12/2017, SpO2 99 %.  PHYSICAL EXAMINATION:  Physical  Exam  GENERAL:  36 y.o.-year-old anxious patient lying in the bed with no acute distress.  EYES: Pupils equal, round, reactive to light and accommodation. No scleral icterus. Extraocular muscles intact.  HEENT: Head atraumatic, normocephalic. Oropharynx and nasopharynx clear.  NECK:  Supple, no jugular venous distention. No thyroid enlargement, no tenderness.  LUNGS: Decreased breath sounds all over without no wheezing, rales,rhonchi or crepitation. Does have upper airway wheeze. No use of accessory muscles of respiration.  CARDIOVASCULAR: S1, S2 normal. No murmurs, rubs, or gallops.  ABDOMEN: Soft, nontender, nondistended. Bowel sounds present. No organomegaly or mass.  EXTREMITIES: No pedal edema, cyanosis, or clubbing.  NEUROLOGIC: Cranial nerves II through XII are intact. Muscle strength 5/5 in all extremities. Sensation intact. Gait not checked.  PSYCHIATRIC: The patient is alert and oriented x 3.  SKIN: Erythematous confluent rash noted on lower extremities anteriorly. Appears more faded now. No open blisters or lesions.   LABORATORY PANEL:   CBC Recent Labs  Lab 10/18/17 2219  WBC 15.2*  HGB 13.9  HCT 41.8  PLT 338   ------------------------------------------------------------------------------------------------------------------  Chemistries  Recent Labs  Lab 10/20/17 0555  NA 134*  K 4.4  CL 106  CO2 21*  GLUCOSE 131*  BUN 13  CREATININE 0.64  CALCIUM 8.9   ------------------------------------------------------------------------------------------------------------------  Cardiac Enzymes Recent Labs  Lab 10/19/17 0623  TROPONINI <0.03   ------------------------------------------------------------------------------------------------------------------  RADIOLOGY:  No results found.  EKG:   Orders placed or performed during the hospital encounter of 10/18/17  . ED EKG  . ED EKG    ASSESSMENT AND PLAN:   36 year old female with past medical history  significant  for obesity, asthma, headaches presents to hospital secondary to asthma exacerbation  #1 acute on chronic asthma exacerbation-improved upper airway wheezing,  -  on IV Solu-Medrol -Added Levaquin for bronchitis -Continue inhalers and nebulizer treatments.  - cough medication and muscle relaxants added.  #2 chest pain-musculoskeletal chest pain from coughing.  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  #4 anxiety-emotionally upset due to recent stressful factors including father's demise. Added Xanax as needed while on steroids  #5 DVT prophylaxis-on Lovenox   Improving now- likely discharge today or tomorrow PT consult for weakness   All the records are reviewed and case discussed with Care Management/Social Workerr. Management plans discussed with the patient, family and they are in agreement.  CODE STATUS: Full code  TOTAL TIME TAKING CARE OF THIS PATIENT: 37 minutes.   POSSIBLE D/C TODAY or TOMORROW, DEPENDING ON CLINICAL CONDITION.   Enid BaasKALISETTI,Galdino Hinchman M.D on 10/21/2017 at 1:48 PM  Between 7am to 6pm - Pager - [phone]  After 6pm go to [email or web address] - password [email or web address]  [facility]  Office  [phone]  CC: Primary care physician; Patient, No Pcp Per

## 2017-10-21 NOTE — Progress Notes (Signed)
PHARMACIST - PHYSICIAN COMMUNICATION DR:   Kalisetti CONCERNING: Antibiotic IV to Oral Route Change Policy  RECOMMENDATION: This patient is receiving levofloxacin by the intravenous route.  Based on criteria approved by the Pharmacy and Therapeutics Committee, the antibiotic(s) is/are being converted to the equivalent oral dose form(s).   DESCRIPTION: These criteria include:  Patient being treated for a respiratory tract infection, urinary tract infection, cellulitis or clostridium difficile associated diarrhea if on metronidazole  The patient is not neutropenic and does not exhibit a GI malabsorption state  The patient is eating (either orally or via tube) and/or has been taking other orally administered medications for a least 24 hours  The patient is improving clinically and has a Tmax < 100.5  If you have questions about this conversion, please contact the Pharmacy Department  []  ( [phone] )  [place] [x]  ( [phone] )  [facility] []  ( [phone] )  [place] []  ( [phone] )  [facility] []  ( [phone] )  [facility]   

## 2017-10-22 LAB — VARICELLA-ZOSTER BY PCR: Varicella-Zoster, PCR: NEGATIVE

## 2017-10-22 MED ORDER — HYDROCOD POLST-CPM POLST ER 10-8 MG/5ML PO SUER
5.0000 mL | Freq: Two times a day (BID) | ORAL | 0 refills | Status: DC
Start: 2017-10-22 — End: 2018-05-07

## 2017-10-22 MED ORDER — LEVOFLOXACIN 500 MG PO TABS: 500.0000 mg | ORAL_TABLET | Freq: Every day | ORAL | 0 refills | Status: DC

## 2017-10-22 MED ORDER — ALPRAZOLAM 0.25 MG PO TABS: 0.2500 mg | ORAL_TABLET | Freq: Three times a day (TID) | ORAL | 0 refills | Status: DC | PRN

## 2017-10-22 MED ORDER — AMLODIPINE BESYLATE 10 MG PO TABS: 10.0000 mg | ORAL_TABLET | Freq: Every day | ORAL | 1 refills | Status: DC

## 2017-10-22 MED ORDER — CYCLOBENZAPRINE HCL 7.5 MG PO TABS: 7.5000 mg | ORAL_TABLET | Freq: Three times a day (TID) | ORAL | 0 refills | Status: DC | PRN

## 2017-10-22 MED ORDER — PREDNISONE 10 MG (21) PO TBPK: ORAL_TABLET | ORAL | 0 refills | Status: DC

## 2017-10-22 MED ORDER — BENZONATATE 200 MG PO CAPS: 200.0000 mg | ORAL_CAPSULE | Freq: Three times a day (TID) | ORAL | 0 refills | Status: DC

## 2017-10-22 MED ORDER — IPRATROPIUM-ALBUTEROL 0.5-2.5 (3) MG/3ML IN SOLN: 3.0000 mL | Freq: Four times a day (QID) | RESPIRATORY_TRACT | 0 refills | Status: DC | PRN

## 2017-10-22 MED ORDER — MOMETASONE FURO-FORMOTEROL FUM 200-5 MCG/ACT IN AERO: 2.0000 | INHALATION_SPRAY | Freq: Two times a day (BID) | RESPIRATORY_TRACT | 2 refills | Status: DC

## 2017-10-22 NOTE — Care Management (Addendum)
Met with patient that bathing herself on hospital toilet with rolling walker sitting in front of her. She states that her children's father will "be home from truck and she will not need home health therapy".  This has been discussed with MD. Patient states she has her fathers walker available for use at home. No PCP. Advised patient to talk to [facility] officer and arrange PCP ASAP. She has a working nebulizer at home.  No other RNCM needs.

## 2017-10-22 NOTE — Progress Notes (Signed)
Physical Therapy Treatment Patient Details Name: Cassandra Cortez MRN: [id number] DOB: December 30, 1980 Today's Date: 10/22/2017    History of Present Illness The patient with past medical history of asthma presents to the emergency department complaining of shortness of breath that has become progressively worse over the last week.  The patient states that she had a few episodes of wheezing that were relieved by her albuterol inhalers but that her cough and shortness of breath has worsened.  The cough is nonproductive.  She has some chest pain when she coughs.  In the emergency department her wheezing and air movement did not improve after multiple breathing treatments in addition to Solu-Medrol, thus the emergency department staff called the hospitalist service for admission. Pt currently admitted under observation status for acute on chronic asthma exacerbation, chest pain secondary to cough, and rash on bilateral LEs.     PT Comments    Pt awaiting discharge.  Stated she feels better today.  Requesting to walk to bathroom to bathe with nurse tech. Pt was able to stand and ambulate to bathroom with walker and min guard/supervision.  Education to advance left/weaker leg first for safety and to stay up in walker with gait.  Stated she felt more comfortable with gait today. Updated nurse tech on progress.  Discussed with primary MD.   Follow Up Recommendations  Home health PT     Equipment Recommendations  None recommended by PT;Other (comment)    Recommendations for Other Services       Precautions / Restrictions Precautions Precautions: None Restrictions Weight Bearing Restrictions: No    Mobility  Bed Mobility Overal bed mobility: Modified Independent                Transfers Overall transfer level: Modified independent Equipment used: Right platform walker Transfers: Sit to/from Stand Sit to Stand: Modified independent (Device/Increase time)             Ambulation/Gait Ambulation/Gait assistance: Min guard Ambulation Distance (Feet): 10 Feet Assistive device: Rolling walker (2 wheeled) Gait Pattern/deviations: Step-to pattern Gait velocity: Decreased Gait velocity interpretation: <1.8 ft/sec, indicative of risk for recurrent falls General Gait Details: Step to pattern with vc's to step with left/weaker side first for safety.  no buckling noted   Stairs            Wheelchair Mobility    Modified Rankin (Stroke Patients Only)       Balance Overall balance assessment: Needs assistance Sitting-balance support: No upper extremity supported Sitting balance-Leahy Scale: Good     Standing balance support: No upper extremity supported Standing balance-Leahy Scale: Fair                              Cognition Arousal/Alertness: Awake/alert Behavior During Therapy: WFL for tasks assessed/performed Overall Cognitive Status: Within Functional Limits for tasks assessed                                        Exercises      General Comments        Pertinent Vitals/Pain Pain Assessment: No/denies pain    Home Living                      Prior Function            PT Goals (current goals can now  be found in the care plan section) Progress towards PT goals: Progressing toward goals    Frequency           PT Plan Current plan remains appropriate    Co-evaluation              AM-PAC PT "6 Clicks" Daily Activity  Outcome Measure  Difficulty turning over in bed (including adjusting bedclothes, sheets and blankets)?: None Difficulty moving from lying on back to sitting on the side of the bed? : None Difficulty sitting down on and standing up from a chair with arms (e.g., wheelchair, bedside commode, etc,.)?: A Little Help needed moving to and from a bed to chair (including a wheelchair)?: A Little Help needed walking in hospital room?: A Little Help needed climbing  3-5 steps with a railing? : Total 6 Click Score: 18    End of Session Equipment Utilized During Treatment: Gait belt Activity Tolerance: Patient tolerated treatment well Patient left: Other (comment);with nursing/sitter in room         Time: 1130-1140 PT Time Calculation (min) (ACUTE ONLY): 10 min  Charges:  $Gait Training: 8-22 mins                    G Codes:       Danielle DessSarah Amorah Sebring, PTA 10/22/17, 11:56 AM

## 2017-10-22 NOTE — Progress Notes (Signed)
Pt discharged to home via wheelchair without incident per MD order accompanied by significant other. Prior to d/c, all teachings done both written and verbal, all questions answered and pt agrees to comply to teaching. Upon d/c, no change in patient from AM assessment. Pt pain controlled on d/c.

## 2017-10-22 NOTE — Discharge Summary (Signed)
[facility] [place] [facility]   PATIENT NAME: Cassandra Cortez    MR#:  [id number]  DATE OF BIRTH:  12-03-1981  DATE OF ADMISSION:  10/18/2017   ADMITTING PHYSICIAN: Arnaldo Natal, MD  DATE OF DISCHARGE: 10/22/17  PRIMARY CARE PHYSICIAN: Patient, No Pcp Per   ADMISSION DIAGNOSIS:   Moderate persistent asthma with status asthmaticus [J45.42]  DISCHARGE DIAGNOSIS:   Active Problems:   Asthma exacerbation   SECONDARY DIAGNOSIS:   Past Medical History:  Diagnosis Date  . Asthma     HOSPITAL COURSE:   36 year old female with past medical history significant for obesity, asthma, headaches presents to hospital secondary to asthma exacerbation  #1 acute on chronic asthma exacerbation-improved  wheezing,  -  on IV Solu-Medrol-changed to prednisone taper at discharge -Added Levaquin for bronchitis -Continue inhalers and nebulizer treatments.  - cough medication and muscle relaxants added.  #2 chest pain-musculoskeletal chest pain from coughing.  -Cough meds and muscle relaxants added  #3 rash on lower extremities-could be eczema versus old herpetic lesions reactivated -received Valtrex- however varicella - zoster PCR swab came back negative. Faded off lesions -d/c isolation and also valtrex.  #4 anxiety-emotionally upset due to recent stressful factors including father's demise. Added Xanax as needed while on steroids    Worked with physical therapy, recommended home health. -Will be discharged today    DISCHARGE CONDITIONS:   Guarded  CONSULTS OBTAINED:   None  DRUG ALLERGIES:   Allergies  Allergen Reactions  . Penicillins Swelling and Rash    Has patient had a PCN reaction causing immediate rash, facial/tongue/throat swelling, SOB or lightheadedness with hypotension: Yes Has patient had a PCN reaction causing severe rash involving mucus membranes or skin necrosis: No Has patient had a PCN reaction that required  hospitalization No Has patient had a PCN reaction occurring within the last 10 years: No If all of the above answers are "NO", then may proceed with Cephalosporin use.  . Sulfa Antibiotics Swelling and Rash   DISCHARGE MEDICATIONS:   Allergies as of 10/22/2017      Reactions   Penicillins Swelling, Rash   Has patient had a PCN reaction causing immediate rash, facial/tongue/throat swelling, SOB or lightheadedness with hypotension: Yes Has patient had a PCN reaction causing severe rash involving mucus membranes or skin necrosis: No Has patient had a PCN reaction that required hospitalization No Has patient had a PCN reaction occurring within the last 10 years: No If all of the above answers are "NO", then may proceed with Cephalosporin use.   Sulfa Antibiotics Swelling, Rash      Medication List    STOP taking these medications   budesonide-formoterol 160-4.5 MCG/ACT inhaler Commonly known as:  SYMBICORT Replaced by:  mometasone-formoterol 200-5 MCG/ACT Aero     TAKE these medications   albuterol 108 (90 Base) MCG/ACT inhaler Commonly known as:  PROVENTIL HFA;VENTOLIN HFA Inhale 1-2 puffs every 6 (six) hours as needed into the lungs for wheezing or shortness of breath. What changed:  Another medication with the same name was removed. Continue taking this medication, and follow the directions you see here.   ALPRAZolam 0.25 MG tablet Commonly known as:  XANAX Take 1 tablet (0.25 mg total) 3 (three) times daily as needed by mouth for anxiety.   amLODipine 10 MG tablet Commonly known as:  NORVASC Take 1 tablet (10 mg total) daily by mouth. Start taking on:  10/23/2017   benzonatate 200 MG capsule Commonly known as:  TESSALON Take 1 capsule (200 mg total) 3 (three) times daily by mouth.   chlorpheniramine-HYDROcodone 10-8 MG/5ML Suer Commonly known as:  TUSSIONEX Take 5 mLs every 12 (twelve) hours by mouth.   cyclobenzaprine 7.5 MG tablet Commonly known as:  FEXMID Take 1  tablet (7.5 mg total) 3 (three) times daily as needed by mouth for muscle spasms.   ipratropium-albuterol 0.5-2.5 (3) MG/3ML Soln Commonly known as:  DUONEB Take 3 mLs 4 (four) times daily as needed by nebulization (asthma.).   levofloxacin 500 MG tablet Commonly known as:  LEVAQUIN Take 1 tablet (500 mg total) at bedtime by mouth. X 4 days   mometasone-formoterol 200-5 MCG/ACT Aero Commonly known as:  DULERA Inhale 2 puffs 2 (two) times daily into the lungs. Replaces:  budesonide-formoterol 160-4.5 MCG/ACT inhaler   predniSONE 10 MG (21) Tbpk tablet Commonly known as:  STERAPRED UNI-PAK 21 TAB 6 tabs po qdaily x 1 day- followed by taper off by 10 mg every day until finished.        DISCHARGE INSTRUCTIONS:   1. PCP f/u in 1 week  DIET:   Cardiac diet  ACTIVITY:   Activity as tolerated  OXYGEN:   Home Oxygen: No.  Oxygen Delivery: room air  DISCHARGE LOCATION:   home   If you experience worsening of your admission symptoms, develop shortness of breath, life threatening emergency, suicidal or homicidal thoughts you must seek medical attention immediately by calling 911 or calling your MD immediately  if symptoms less severe.  You Must read complete instructions/literature along with all the possible adverse reactions/side effects for all the Medicines you take and that have been prescribed to you. Take any new Medicines after you have completely understood and accpet all the possible adverse reactions/side effects.   Please note  You were cared for by a hospitalist during your hospital stay. If you have any questions about your discharge medications or the care you received while you were in the hospital after you are discharged, you can call the unit and asked to speak with the hospitalist on call if the hospitalist that took care of you is not available. Once you are discharged, your primary care physician will handle any further medical issues. Please note that NO  REFILLS for any discharge medications will be authorized once you are discharged, as it is imperative that you return to your primary care physician (or establish a relationship with a primary care physician if you do not have one) for your aftercare needs so that they can reassess your need for medications and monitor your lab values.    On the day of Discharge:  VITAL SIGNS:   Blood pressure (!) 128/97, pulse 87, temperature 98.2 F (36.8 C), temperature source Oral, resp. rate 20, height 5' 1.5" (1.562 m), weight 104.3 kg (230 lb), last menstrual period 10/12/2017, SpO2 99 %.  PHYSICAL EXAMINATION:    GENERAL:  36 y.o.-year-old anxious patient lying in the bed with no acute distress.  EYES: Pupils equal, round, reactive to light and accommodation. No scleral icterus. Extraocular muscles intact.  HEENT: Head atraumatic, normocephalic. Oropharynx and nasopharynx clear.  NECK:  Supple, no jugular venous distention. No thyroid enlargement, no tenderness.  LUNGS: normal breath sounds all over minimal occasional scattered wheezing, no  rales,rhonchi or crepitation.  No use of accessory muscles of respiration.  CARDIOVASCULAR: S1, S2 normal. No murmurs, rubs, or gallops.  ABDOMEN: Soft, nontender, nondistended. Bowel sounds present. No organomegaly or mass.  EXTREMITIES: No pedal edema,  cyanosis, or clubbing.  NEUROLOGIC: Cranial nerves II through XII are intact. Muscle strength 5/5 in all extremities. Sensation intact. Gait not checked.  PSYCHIATRIC: The patient is alert and oriented x 3.  SKIN: Erythematous confluent rash noted on lower extremities anteriorly. Appears more faded now. No open blisters or lesions.    DATA REVIEW:   CBC Recent Labs  Lab 10/18/17 2219  WBC 15.2*  HGB 13.9  HCT 41.8  PLT 338    Chemistries  Recent Labs  Lab 10/20/17 0555  NA 134*  K 4.4  CL 106  CO2 21*  GLUCOSE 131*  BUN 13  CREATININE 0.64  CALCIUM 8.9     Microbiology Results    Results for orders placed or performed during the hospital encounter of 06/07/15  Wet prep, genital     Status: Abnormal   Collection Time: 06/07/15 11:20 PM  Result Value Ref Range Status   Yeast Wet Prep HPF POC NONE SEEN NONE SEEN Final   Trich, Wet Prep NONE SEEN NONE SEEN Final   Clue Cells Wet Prep HPF POC FEW (A) NONE SEEN Final   WBC, Wet Prep HPF POC MODERATE (A) NONE SEEN Final  Chlamydia/NGC rt PCR ([facility] only)     Status: None   Collection Time: 06/07/15 11:20 PM  Result Value Ref Range Status   Specimen source GC/Chlam VAGINA  Final   Chlamydia Tr NOT DETECTED NOT DETECTED Final   N gonorrhoeae NOT DETECTED NOT DETECTED Final    Comment: (NOTE) 100  This methodology has not been evaluated in pregnant women or in 200  patients with a history of hysterectomy. 300 400  This methodology will not be performed on patients less than 2514  years of age.     RADIOLOGY:  No results found.   Management plans discussed with the patient, family and they are in agreement.  CODE STATUS:     Code Status Orders  (From admission, onward)        Start     Ordered   10/19/17 0358  Full code  Continuous     10/19/17 0357    Code Status History    Date Active Date Inactive Code Status Order ID Comments User Context   10/28/2015 15:53 11/01/2015 19:33 Full Code [id number]  Marguarite ArbourSparks, Jeffrey D, MD Inpatient      TOTAL TIME TAKING CARE OF THIS PATIENT: 38 minutes.    Enid BaasKALISETTI,Ashia Dehner M.D on 10/22/2017 at 11:52 AM  Between 7am to 6pm - Pager - [phone]  After 6pm go to [email or web address] - Social research officer, [email or web address] [email or web address]  [facility] [place] [facility]  Office  [phone]  CC: Primary care physician; Patient, No Pcp Per   Note: This dictation was prepared with Dragon dictation along with smaller phrase technology. Any transcriptional errors that result from this process are unintentional.

## 2018-05-07 ENCOUNTER — Encounter: Payer: Self-pay | Admitting: Emergency Medicine

## 2018-05-07 ENCOUNTER — Emergency Department: Payer: Medicaid Other

## 2018-05-07 ENCOUNTER — Other Ambulatory Visit: Payer: Self-pay

## 2018-05-07 ENCOUNTER — Emergency Department
Admission: EM | Admit: 2018-05-07 | Discharge: 2018-05-07 | Disposition: A | Discharge status: Home / Self Care | Payer: Medicaid Other | Attending: Emergency Medicine | Admitting: Emergency Medicine

## 2018-05-07 DIAGNOSIS — R6883 Chills (without fever): Secondary | ICD-10-CM | POA: Diagnosis not present

## 2018-05-07 DIAGNOSIS — R112 Nausea with vomiting, unspecified: Secondary | ICD-10-CM | POA: Insufficient documentation

## 2018-05-07 DIAGNOSIS — R1010 Upper abdominal pain, unspecified: Secondary | ICD-10-CM

## 2018-05-07 DIAGNOSIS — R1011 Right upper quadrant pain: Secondary | ICD-10-CM | POA: Insufficient documentation

## 2018-05-07 DIAGNOSIS — R61 Generalized hyperhidrosis: Secondary | ICD-10-CM | POA: Diagnosis not present

## 2018-05-07 DIAGNOSIS — J45909 Unspecified asthma, uncomplicated: Secondary | ICD-10-CM | POA: Diagnosis not present

## 2018-05-07 LAB — COMPREHENSIVE METABOLIC PANEL
ALT: 32 U/L (ref 14–54)
AST: 37 U/L (ref 15–41)
Albumin: 4 g/dL (ref 3.5–5.0)
Alkaline Phosphatase: 34 U/L — ABNORMAL LOW (ref 38–126)
Anion gap: 6 (ref 5–15)
BUN: 12 mg/dL (ref 6–20)
CO2: 24 mmol/L (ref 22–32)
CREATININE: 0.75 mg/dL (ref 0.44–1.00)
Calcium: 8.8 mg/dL — ABNORMAL LOW (ref 8.9–10.3)
Chloride: 106 mmol/L (ref 101–111)
Glucose, Bld: 109 mg/dL — ABNORMAL HIGH (ref 65–99)
Potassium: 3.7 mmol/L (ref 3.5–5.1)
Sodium: 136 mmol/L (ref 135–145)
TOTAL PROTEIN: 7.7 g/dL (ref 6.5–8.1)
Total Bilirubin: 0.4 mg/dL (ref 0.3–1.2)

## 2018-05-07 LAB — CBC WITH DIFFERENTIAL/PLATELET
BASOS ABS: 0.1 10*3/uL (ref 0–0.1)
Basophils Relative: 1 %
Eosinophils Absolute: 0.4 10*3/uL (ref 0–0.7)
Eosinophils Relative: 5 %
HEMATOCRIT: 41.5 % (ref 35.0–47.0)
Hemoglobin: 14.2 g/dL (ref 12.0–16.0)
LYMPHS ABS: 3 10*3/uL (ref 1.0–3.6)
Lymphocytes Relative: 36 %
MCH: 31.2 pg (ref 26.0–34.0)
MCHC: 34.1 g/dL (ref 32.0–36.0)
MCV: 91.5 fL (ref 80.0–100.0)
MONO ABS: 0.8 10*3/uL (ref 0.2–0.9)
Monocytes Relative: 10 %
NEUTROS ABS: 4.2 10*3/uL (ref 1.4–6.5)
Neutrophils Relative %: 48 %
Platelets: 314 10*3/uL (ref 150–440)
RBC: 4.53 MIL/uL (ref 3.80–5.20)
RDW: 14 % (ref 11.5–14.5)
WBC: 8.6 10*3/uL (ref 3.6–11.0)

## 2018-05-07 LAB — URINALYSIS, COMPLETE (UACMP) WITH MICROSCOPIC
Bacteria, UA: NONE SEEN
Bilirubin Urine: NEGATIVE
Glucose, UA: NEGATIVE mg/dL
KETONES UR: NEGATIVE mg/dL
Leukocytes, UA: NEGATIVE
Nitrite: NEGATIVE
PH: 5 (ref 5.0–8.0)
Protein, ur: 30 mg/dL — AB
SPECIFIC GRAVITY, URINE: 1.034 — AB (ref 1.005–1.030)

## 2018-05-07 LAB — LIPASE, BLOOD: LIPASE: 35 U/L (ref 11–51)

## 2018-05-07 MED ORDER — ONDANSETRON HCL 4 MG/2ML IJ SOLN
4.0000 mg | Freq: Once | INTRAMUSCULAR | Status: AC
  Administered 2018-05-07: 4 mg via INTRAVENOUS
  Filled 2018-05-07: qty 2

## 2018-05-07 MED ORDER — SODIUM CHLORIDE 0.9 % IV SOLN
Freq: Once | INTRAVENOUS | Status: AC
  Administered 2018-05-07: 09:00:00 via INTRAVENOUS

## 2018-05-07 MED ORDER — OXYCODONE-ACETAMINOPHEN 5-325 MG PO TABS: 1.0000 | ORAL_TABLET | Freq: Three times a day (TID) | ORAL | 0 refills | Status: DC | PRN

## 2018-05-07 MED ORDER — MORPHINE SULFATE (PF) 4 MG/ML IV SOLN
4.0000 mg | Freq: Once | INTRAVENOUS | Status: AC
Start: 2018-05-07 — End: 2018-05-07
  Administered 2018-05-07: 4 mg via INTRAVENOUS
  Filled 2018-05-07: qty 1

## 2018-05-07 MED ORDER — OXYCODONE-ACETAMINOPHEN 5-325 MG PO TABS
2.0000 | ORAL_TABLET | Freq: Once | ORAL | Status: AC
  Administered 2018-05-07: 2 via ORAL
  Filled 2018-05-07: qty 2

## 2018-05-07 MED ORDER — ONDANSETRON 4 MG PO TBDP: 4.0000 mg | ORAL_TABLET | Freq: Three times a day (TID) | ORAL | 0 refills | Status: DC | PRN

## 2018-05-07 MED ORDER — MORPHINE SULFATE (PF) 4 MG/ML IV SOLN
4.0000 mg | Freq: Once | INTRAVENOUS | Status: AC
  Administered 2018-05-07: 4 mg via INTRAVENOUS
  Filled 2018-05-07: qty 1

## 2018-05-07 MED ORDER — FAMOTIDINE IN NACL 20-0.9 MG/50ML-% IV SOLN
20.0000 mg | Freq: Once | INTRAVENOUS | Status: AC
  Administered 2018-05-07: 20 mg via INTRAVENOUS
  Filled 2018-05-07: qty 50

## 2018-05-07 MED ORDER — IOPAMIDOL (ISOVUE-300) INJECTION 61%
100.0000 mL | Freq: Once | INTRAVENOUS | Status: AC | PRN
  Administered 2018-05-07: 100 mL via INTRAVENOUS

## 2018-05-07 MED ORDER — FAMOTIDINE 20 MG PO TABS: 20.0000 mg | ORAL_TABLET | Freq: Two times a day (BID) | ORAL | 1 refills | Status: DC

## 2018-05-07 NOTE — ED Provider Notes (Signed)
[facility] Emergency Department Provider Note       Time seen: ----------------------------------------- 8:31 AM on 05/07/2018 -----------------------------------------   I have reviewed the triage vital signs and the nursing notes.  HISTORY   Chief Complaint Abdominal Pain and Emesis    HPI Cassandra Cortez is a 37 y.o. female with a history of asthma who presents to the ED for epigastric and right upper quadrant abdominal pain that radiates into her back and shoulder.  Patient states she vomits anything that she tries to eat or drink, particularly when she tries to drink anything.  Patient states her symptoms started Monday and then got worse Tuesday night.  She had to leave work because of the nausea vomiting.  Initially she thought her son had given her her virus.  She reports the pain comes and goes, she has had chills and sweats as well.  Past Medical History:  Diagnosis Date  . Asthma     Patient Active Problem List   Diagnosis Date Noted  . Asthma exacerbation 10/28/2015  . Acute respiratory distress 10/28/2015  . Asthma 10/28/2015    Past Surgical History:  Procedure Laterality Date  . APPENDECTOMY    . CESAREAN SECTION    . LEEP      Allergies Penicillins and Sulfa antibiotics  Social History Social History   Tobacco Use  . Smoking status: Never Smoker  . Smokeless tobacco: Never Used  Substance Use Topics  . Alcohol use: Yes  . Drug use: No   Review of Systems Constitutional: Positive for fevers, chills, sweats Cardiovascular: Negative for chest pain. Respiratory: Negative for shortness of breath. Gastrointestinal: Positive for abdominal pain and vomiting Musculoskeletal: Positive for back and shoulder pain Skin: Negative for rash. Neurological: Negative for headaches, focal weakness or numbness.  All systems negative/normal/unremarkable except as stated in the  HPI  ____________________________________________   PHYSICAL EXAM:  VITAL SIGNS: ED Triage Vitals  Enc Vitals Group     BP 05/07/18 0822 130/84     Pulse Rate 05/07/18 0822 92     Resp 05/07/18 0822 16     Temp 05/07/18 0822 98.1 F (36.7 C)     Temp Source 05/07/18 0822 Oral     SpO2 05/07/18 0822 99 %     Weight 05/07/18 0823 230 lb (104.3 kg)     Height 05/07/18 0823 5' 1.5" (1.562 m)     Head Circumference --      Peak Flow --      Pain Score 05/07/18 0823 8     Pain Loc --      Pain Edu? --      Excl. in GC? --    Constitutional: Alert and oriented.  Mild distress from pain Eyes: Conjunctivae are normal. Normal extraocular movements. ENT   Head: Normocephalic and atraumatic.   Nose: No congestion/rhinnorhea.   Mouth/Throat: Mucous membranes are moist.   Neck: No stridor. Cardiovascular: Normal rate, regular rhythm. No murmurs, rubs, or gallops. Respiratory: Normal respiratory effort without tachypnea nor retractions. Breath sounds are clear and equal bilaterally. No wheezes/rales/rhonchi. Gastrointestinal: Epigastric and right upper quadrant pain, worse in the right upper quadrant.  Positive Murphy sign.  Normal bowel sounds. Musculoskeletal: Nontender with normal range of motion in extremities. No lower extremity tenderness nor edema. Neurologic:  Normal speech and language. No gross focal neurologic deficits are appreciated.  Skin:  Skin is warm, dry and intact. No rash noted. Psychiatric: Mood and affect are normal. Speech and behavior  are normal.  ____________________________________________  ED COURSE:  As part of my medical decision making, I reviewed the following data within the electronic MEDICAL RECORD NUMBER History obtained from family if available, nursing notes, old chart and ekg, as well as notes from prior ED visits. Patient presented for right upper quadrant pain and vomiting, we will assess with labs and imaging as indicated at this time.    Procedures ____________________________________________   LABS (pertinent positives/negatives)  Labs Reviewed  COMPREHENSIVE METABOLIC PANEL - Abnormal; Notable for the following components:      Result Value   Glucose, Bld 109 (*)    Calcium 8.8 (*)    Alkaline Phosphatase 34 (*)    All other components within normal limits  URINALYSIS, COMPLETE (UACMP) WITH MICROSCOPIC - Abnormal; Notable for the following components:   Color, Urine YELLOW (*)    APPearance CLOUDY (*)    Specific Gravity, Urine 1.034 (*)    Hgb urine dipstick MODERATE (*)    Protein, ur 30 (*)    All other components within normal limits  CBC WITH DIFFERENTIAL/PLATELET  LIPASE, BLOOD    RADIOLOGY Images were viewed by me  Right upper quadrant ultrasound IMPRESSION: Negative for gallstones.  No acute abnormality.  Fatty infiltration of the liver. ____________________________________________  DIFFERENTIAL DIAGNOSIS   Cholecystitis, biliary colic, GERD, peptic ulcer disease, dehydration  FINAL ASSESSMENT AND PLAN  Abdominal pain  Plan: The patient had presented for right upper quadrant pain and vomiting. Patient's labs did not reveal any acute process. Patient's imaging including ultrasound and CT were negative.  She is having significant upper abdominal pain without any specific etiology likely suggesting GERD or peptic ulcer disease.  She was also given Pepcid here.  She will be discharged with pain medicine, antacids and antiemetics.   Ulice Dash, MD   Note: This note was generated in part or whole with voice recognition software. Voice recognition is usually quite accurate but there are transcription errors that can and very often do occur. I apologize for any typographical errors that were not detected and corrected.     Emily Filbert, MD 05/07/18 1118

## 2018-05-07 NOTE — ED Triage Notes (Signed)
Pt to ED via POV c/o RUQ abd pain that radiates into her back and shoulder. Pt states that she vomits anything that she tries to eat or drink. Pt states that her symptoms started on Monday. Pt is tearful in triage. Pt states that the pain comes and goes.

## 2018-05-07 NOTE — ED Notes (Signed)
Patient transported to Ultrasound 

## 2018-07-16 ENCOUNTER — Other Ambulatory Visit: Payer: Self-pay

## 2018-07-16 ENCOUNTER — Emergency Department: Payer: Medicaid Other

## 2018-07-16 ENCOUNTER — Inpatient Hospital Stay
Admission: EM | Admit: 2018-07-16 | Discharge: 2018-07-17 | DRG: 392 | Disposition: A | Discharge status: Home / Self Care | Payer: Medicaid Other | Attending: Internal Medicine | Admitting: Internal Medicine

## 2018-07-16 DIAGNOSIS — R112 Nausea with vomiting, unspecified: Secondary | ICD-10-CM | POA: Diagnosis present

## 2018-07-16 DIAGNOSIS — J45909 Unspecified asthma, uncomplicated: Secondary | ICD-10-CM | POA: Diagnosis present

## 2018-07-16 DIAGNOSIS — R569 Unspecified convulsions: Secondary | ICD-10-CM

## 2018-07-16 DIAGNOSIS — R1010 Upper abdominal pain, unspecified: Secondary | ICD-10-CM | POA: Diagnosis not present

## 2018-07-16 DIAGNOSIS — R109 Unspecified abdominal pain: Secondary | ICD-10-CM

## 2018-07-16 DIAGNOSIS — R197 Diarrhea, unspecified: Secondary | ICD-10-CM | POA: Diagnosis present

## 2018-07-16 LAB — COMPREHENSIVE METABOLIC PANEL
ALK PHOS: 35 U/L — AB (ref 38–126)
ALT: 28 U/L (ref 0–44)
ANION GAP: 9 (ref 5–15)
AST: 33 U/L (ref 15–41)
Albumin: 4.3 g/dL (ref 3.5–5.0)
BILIRUBIN TOTAL: 0.6 mg/dL (ref 0.3–1.2)
BUN: 9 mg/dL (ref 6–20)
CALCIUM: 8.9 mg/dL (ref 8.9–10.3)
CO2: 21 mmol/L — ABNORMAL LOW (ref 22–32)
Chloride: 107 mmol/L (ref 98–111)
Creatinine, Ser: 0.6 mg/dL (ref 0.44–1.00)
Glucose, Bld: 114 mg/dL — ABNORMAL HIGH (ref 70–99)
Potassium: 4.2 mmol/L (ref 3.5–5.1)
Sodium: 137 mmol/L (ref 135–145)
Total Protein: 8 g/dL (ref 6.5–8.1)

## 2018-07-16 LAB — CBC
HCT: 42.8 % (ref 35.0–47.0)
HEMOGLOBIN: 14.9 g/dL (ref 12.0–16.0)
MCH: 31.7 pg (ref 26.0–34.0)
MCHC: 34.9 g/dL (ref 32.0–36.0)
MCV: 90.9 fL (ref 80.0–100.0)
Platelets: 372 10*3/uL (ref 150–440)
RBC: 4.71 MIL/uL (ref 3.80–5.20)
RDW: 13.7 % (ref 11.5–14.5)
WBC: 11.4 10*3/uL — AB (ref 3.6–11.0)

## 2018-07-16 LAB — TYPE AND SCREEN
ABO/RH(D): A NEG
ANTIBODY SCREEN: NEGATIVE

## 2018-07-16 LAB — LIPASE, BLOOD: Lipase: 73 U/L — ABNORMAL HIGH (ref 11–51)

## 2018-07-16 MED ORDER — FAMOTIDINE IN NACL 20-0.9 MG/50ML-% IV SOLN
20.0000 mg | Freq: Once | INTRAVENOUS | Status: AC
  Administered 2018-07-16: 20 mg via INTRAVENOUS
  Filled 2018-07-16: qty 50

## 2018-07-16 MED ORDER — FENTANYL CITRATE (PF) 100 MCG/2ML IJ SOLN
50.0000 ug | Freq: Once | INTRAMUSCULAR | Status: AC
Start: 2018-07-16 — End: 2018-07-16
  Administered 2018-07-16: 50 ug via INTRAVENOUS
  Filled 2018-07-16: qty 2

## 2018-07-16 MED ORDER — METOCLOPRAMIDE HCL 5 MG/ML IJ SOLN
10.0000 mg | Freq: Once | INTRAMUSCULAR | Status: AC
  Administered 2018-07-16: 10 mg via INTRAVENOUS
  Filled 2018-07-16: qty 2

## 2018-07-16 MED ORDER — KETOROLAC TROMETHAMINE 30 MG/ML IJ SOLN
15.0000 mg | INTRAMUSCULAR | Status: AC
  Administered 2018-07-16: 15 mg via INTRAVENOUS
  Filled 2018-07-16: qty 1

## 2018-07-16 MED ORDER — GI COCKTAIL ~~LOC~~
30.0000 mL | ORAL | Status: AC
  Administered 2018-07-16: 30 mL via ORAL
  Filled 2018-07-16: qty 30

## 2018-07-16 MED ORDER — ONDANSETRON HCL 4 MG/2ML IJ SOLN
4.0000 mg | Freq: Once | INTRAMUSCULAR | Status: AC
  Administered 2018-07-16: 4 mg via INTRAVENOUS
  Filled 2018-07-16: qty 2

## 2018-07-16 MED ORDER — SODIUM CHLORIDE 0.9 % IV BOLUS
1000.0000 mL | Freq: Once | INTRAVENOUS | Status: AC
  Administered 2018-07-16: 1000 mL via INTRAVENOUS

## 2018-07-16 NOTE — ED Triage Notes (Addendum)
Pt c/o severe abd pain that radiates into shoulder blades - c/o diarrhea (8 loose stools in 24 hours) - vomiting (10 times in 24 hours) - pain started yesterday - pt reports that vomit/stool appear to be "coffee grounds"

## 2018-07-16 NOTE — ED Notes (Signed)
Lab called to add on lipase.

## 2018-07-16 NOTE — ED Notes (Signed)
Pt in tears c/o of pain in abdomen that radiates to her shoulder blades. Pain has been going on for 2 days and she states that the same pain occurred about 2 months ago and she came to the ED.

## 2018-07-16 NOTE — ED Notes (Signed)
Pt returned from US

## 2018-07-16 NOTE — ED Notes (Signed)
Updated patient on wait, reassessed vital signs, informed Dr. Pershing ProudSchaevitz of pts WBC and Lipase, new order received for US RUQ

## 2018-07-17 ENCOUNTER — Inpatient Hospital Stay: Payer: Medicaid Other

## 2018-07-17 ENCOUNTER — Encounter: Payer: Self-pay | Admitting: Radiology

## 2018-07-17 LAB — POCT PREGNANCY, URINE: PREG TEST UR: NEGATIVE

## 2018-07-17 MED ORDER — FAMOTIDINE 20 MG PO TABS: 20.0000 mg | ORAL_TABLET | Freq: Two times a day (BID) | ORAL | 0 refills | Status: DC

## 2018-07-17 MED ORDER — ONDANSETRON 4 MG PO TBDP: 4.0000 mg | ORAL_TABLET | Freq: Three times a day (TID) | ORAL | 0 refills | Status: DC | PRN

## 2018-07-17 MED ORDER — MORPHINE SULFATE (PF) 4 MG/ML IV SOLN
4.0000 mg | Freq: Once | INTRAVENOUS | Status: AC
  Administered 2018-07-17: 4 mg via INTRAVENOUS
  Filled 2018-07-17: qty 1

## 2018-07-17 MED ORDER — ONDANSETRON HCL 4 MG/2ML IJ SOLN
4.0000 mg | Freq: Once | INTRAMUSCULAR | Status: AC
  Administered 2018-07-17: 4 mg via INTRAVENOUS
  Filled 2018-07-17: qty 2

## 2018-07-17 MED ORDER — IOHEXOL 300 MG/ML  SOLN
100.0000 mL | Freq: Once | INTRAMUSCULAR | Status: AC | PRN
  Administered 2018-07-17: 100 mL via INTRAVENOUS

## 2018-07-17 MED ORDER — OXYCODONE-ACETAMINOPHEN 5-325 MG PO TABS: 1.0000 | ORAL_TABLET | Freq: Three times a day (TID) | ORAL | 0 refills | Status: DC | PRN

## 2018-07-17 NOTE — ED Provider Notes (Signed)
[facility] Emergency Department Provider Note  ____________________________________________  Time seen: Approximately 12:26 AM  I have reviewed the triage vital signs and the nursing notes.   HISTORY  Chief Complaint Abdominal Pain and Diarrhea    HPI Cassandra Cortez is a 37 y.o. female with a history of seizure and asthma who complains of upper abdominal pain radiating to her shoulders for the past 2 days, worse with eating, no alleviating factors.  Associated with nausea and vomiting as well as diarrhea and dark stool.  Denies any bloody stool.  No dizziness or syncope.  No chest pain shortness of breath fevers or chills.  She is worried that she has a gallstone.  Rates her symptoms as severe.   Of symptoms was after eating at [employer].   Past Medical History:  Diagnosis Date  . Asthma      Patient Active Problem List   Diagnosis Date Noted  . Seizure (HCC) 07/16/2018  . Asthma exacerbation 10/28/2015  . Acute respiratory distress 10/28/2015  . Asthma 10/28/2015     Past Surgical History:  Procedure Laterality Date  . APPENDECTOMY    . CESAREAN SECTION    . LEEP       Prior to Admission medications   Medication Sig Start Date End Date Taking? Authorizing Provider  albuterol (PROVENTIL HFA;VENTOLIN HFA) 108 (90 Base) MCG/ACT inhaler Inhale 1-2 puffs every 6 (six) hours as needed into the lungs for wheezing or shortness of breath.    [provider]  famotidine (PEPCID) 20 MG tablet Take 1 tablet (20 mg total) by mouth 2 (two) times daily. Patient not taking: Reported on 07/16/2018 05/07/18   Emily FilbertWilliams, Jonathan E, MD  ipratropium-albuterol (DUONEB) 0.5-2.5 (3) MG/3ML SOLN Take 3 mLs 4 (four) times daily as needed by nebulization (asthma.). 10/22/17   Enid BaasKalisetti, Radhika, MD  ondansetron (ZOFRAN ODT) 4 MG disintegrating tablet Take 1 tablet (4 mg total) by mouth every 8 (eight) hours as needed for nausea or vomiting. Patient not taking:  Reported on 07/16/2018 05/07/18   Emily FilbertWilliams, Jonathan E, MD  oxyCODONE-acetaminophen (PERCOCET) 5-325 MG tablet Take 1 tablet by mouth every 8 (eight) hours as needed. Patient not taking: Reported on 07/16/2018 05/07/18   Emily FilbertWilliams, Jonathan E, MD     Allergies Sulfasalazine; Penicillins; and Sulfa antibiotics   Family History  Problem Relation Age of Onset  . Asthma Other     Social History Social History   Tobacco Use  . Smoking status: Never Smoker  . Smokeless tobacco: Never Used  Substance Use Topics  . Alcohol use: Yes  . Drug use: No    Review of Systems  Constitutional:   No fever or chills.  ENT:   No sore throat. No rhinorrhea. Cardiovascular:   No chest pain or syncope. Respiratory:   No dyspnea or cough. Gastrointestinal:   Positive as above for abdominal pain and vomiting and diarrhea.  Musculoskeletal:   Negative for focal pain or swelling All other systems reviewed and are negative except as documented above in ROS and HPI.  ____________________________________________   PHYSICAL EXAM:  VITAL SIGNS: ED Triage Vitals  Enc Vitals Group     BP 07/16/18 1533 (!) 159/109     Pulse Rate 07/16/18 1533 95     Resp 07/16/18 1533 18     Temp 07/16/18 1533 98.4 F (36.9 C)     Temp Source 07/16/18 1533 Oral     SpO2 07/16/18 1533 99 %  Weight 07/16/18 1534 232 lb (105.2 kg)     Height 07/16/18 1534 5\' 1"  (1.549 m)     Head Circumference --      Peak Flow --      Pain Score 07/16/18 1534 8     Pain Loc --      Pain Edu? --      Excl. in GC? --     Vital signs reviewed, nursing assessments reviewed.   Constitutional:   Alert and oriented. Non-toxic appearance. Eyes:   Conjunctivae are normal. EOMI. PERRL. ENT      Head:   Normocephalic and atraumatic.      Nose:   No congestion/rhinnorhea.       Mouth/Throat:   MMM, no pharyngeal erythema. No peritonsillar mass.       Neck:   No meningismus. Full ROM. Hematological/Lymphatic/Immunilogical:   No  cervical lymphadenopathy. Cardiovascular:   RRR. Symmetric bilateral radial and DP pulses.  No murmurs. Cap refill less than 2 seconds. Respiratory:   Normal respiratory effort without tachypnea/retractions. Breath sounds are clear and equal bilaterally. No wheezes/rales/rhonchi. Gastrointestinal:   Soft with diffuse upper abdominal tenderness. Non distended. There is no CVA tenderness.  No rebound, rigidity, or guarding. Musculoskeletal:   Normal range of motion in all extremities. No joint effusions.  No lower extremity tenderness.  No edema. Neurologic:   Normal speech and language.  Motor grossly intact. No acute focal neurologic deficits are appreciated.  Skin:    Skin is warm, dry and intact. No rash noted.  No petechiae, purpura, or bullae.  ____________________________________________    LABS (pertinent positives/negatives) (all labs ordered are listed, but only abnormal results are displayed) Labs Reviewed  COMPREHENSIVE METABOLIC PANEL - Abnormal; Notable for the following components:      Result Value   CO2 21 (*)    Glucose, Bld 114 (*)    Alkaline Phosphatase 35 (*)    All other components within normal limits  CBC - Abnormal; Notable for the following components:   WBC 11.4 (*)    All other components within normal limits  LIPASE, BLOOD - Abnormal; Notable for the following components:   Lipase 73 (*)    All other components within normal limits  POC OCCULT BLOOD, ED  POC URINE PREG, ED  TYPE AND SCREEN   ____________________________________________   EKG    ____________________________________________    RADIOLOGY  US Abdomen Limited Ruq  Result Date: 07/16/2018 CLINICAL DATA:  Abdominal pain for 1 day. EXAM: ULTRASOUND ABDOMEN LIMITED RIGHT UPPER QUADRANT COMPARISON:  Ultrasound 05/07/2018.  CT 05/07/2018. FINDINGS: Gallbladder: No gallstones or wall thickening visualized. No sonographic Murphy sign noted by sonographer. Common bile duct: Diameter: 4 mm  Liver: The hepatic echogenicity is diffusely increased, consistent with steatosis. No focal lesion identified. Portal vein is patent on color Doppler imaging with normal direction of blood flow towards the liver. IMPRESSION: No acute right upper quadrant abdominal findings. Hepatic steatosis. Electronically Signed   By: Carey Bullocks M.D.   On: 07/16/2018 18:39    ____________________________________________   PROCEDURES Procedures  ____________________________________________  DIFFERENTIAL DIAGNOSIS   Cholelithiasis, cholecystitis, pancreatitis, choledocholithiasis, colitis, GERD  CLINICAL IMPRESSION / ASSESSMENT AND PLAN / ED COURSE  Pertinent labs & imaging results that were available during my care of the patient were reviewed by me and considered in my medical decision making (see chart for details).    Patient presents with abdominal pain, poorly localized associate with nausea vomiting diarrhea.  Likely a gastroenteritis  or infectious colitis.  Vital signs are normal, afebrile.  Labs are normal without leukocytosis or other concerning features.  LFTs are normal.  Ultrasound of right upper quadrant is unremarkable without signs of information, infection, or gallstones.  Patient given IV fluids for hydration, medications for pain nausea and acid relief.    ----------------------------------------- 12:28 AM on 07/17/2018 -----------------------------------------  Ongoing sx despite repeat med management.  Will obtain CT a/p. Care signed out to Dr. Manson Passey .      ____________________________________________   FINAL CLINICAL IMPRESSION(S) / ED DIAGNOSES    Final diagnoses:  Abdominal pain  Diarrhea of presumed infectious origin     ED Discharge Orders    None      Portions of this note were generated with dragon dictation software. Dictation errors may occur despite best attempts at proofreading.    Sharman Cheek, MD 07/17/18 [phone]

## 2018-07-20 ENCOUNTER — Encounter

## 2018-07-20 ENCOUNTER — Ambulatory Visit (INDEPENDENT_AMBULATORY_CARE_PROVIDER_SITE_OTHER): Payer: Medicaid Other | Admitting: Gastroenterology

## 2018-07-20 ENCOUNTER — Encounter: Payer: Self-pay | Admitting: Gastroenterology

## 2018-07-20 VITALS — BP 128/85 | HR 89 | Ht 61.5 in | Wt 247.0 lb

## 2018-07-20 DIAGNOSIS — G8929 Other chronic pain: Secondary | ICD-10-CM

## 2018-07-20 DIAGNOSIS — R197 Diarrhea, unspecified: Secondary | ICD-10-CM | POA: Diagnosis not present

## 2018-07-20 DIAGNOSIS — R1011 Right upper quadrant pain: Secondary | ICD-10-CM | POA: Diagnosis not present

## 2018-07-20 NOTE — Progress Notes (Signed)
Cassandra Cortez [address]  [address]  [place], [place] [address]  Main: [phone]  Fax: [phone]   Gastroenterology Consultation  Referring Provider:     Dr. Scotty Court Primary Care Physician:  Patient, No Pcp Per Primary Gastroenterologist:  Dr. Melodie Cortez Reason for Consultation:     Abdominal pain, N/V, Diarrhea        HPI:    Chief Complaint  Patient presents with  . Follow-up    hospital-diarrhea, abdominal pain and n/v    Cassandra Cortez is a 37 y.o. y/o female referred for consultation & management  by Dr. Patient, No Pcp Per.  Patient reports history of right upper quadrant abdominal pain, radiating to the back, that started in May 2019, initially lasted for 2 weeks, and then resolved, but reoccurred over the last week.  Describes the pain as cramping, associated with nausea and vomiting, constant, exacerbated with laying on the right side, worsens immediately after meal, but patient still has it without eating.  No weight loss.  Went to the ER in May 2019 and imaging and lab work was benign, and was given Pepcid and discharged with pain medication and antiemetics.  States symptoms improved after 2 weeks and she was having regular bowel movements after that.  July 17, 2018 110 with similar symptoms, ultrasound and CT scan and lab work were again unrevealing.  Patient reports occasional naproxen and Aleve use.  States only uses it about once or twice a week about 1-2 times a month.  Reports that she eats she feels like she needs to have a bowel movement, and at times because the bathroom but nothing comes out.  Over the last week, has had loose stools.  Has noticed minute amount of bright red blood in the stool about 2-3 times, last time being about 2 weeks ago.  At times strains when nothing is coming out when she is sitting on the toilet.  No prior stool testing.  No prior colonoscopy or EGD.    Father died at 61 of colon cancer, and she thinks he was  diagnosed at least 15 months prior to that.   Past Medical History:  Diagnosis Date  . Asthma     Past Surgical History:  Procedure Laterality Date  . APPENDECTOMY    . CESAREAN SECTION    . LEEP      Prior to Admission medications   Medication Sig Start Date End Date Taking? Authorizing Provider  albuterol (PROVENTIL HFA;VENTOLIN HFA) 108 (90 Base) MCG/ACT inhaler Inhale 1-2 puffs every 6 (six) hours as needed into the lungs for wheezing or shortness of breath.   Yes [provider]  famotidine (PEPCID) 20 MG tablet Take 1 tablet (20 mg total) by mouth 2 (two) times daily. 07/17/18  Yes Darci Current, MD  ipratropium-albuterol (DUONEB) 0.5-2.5 (3) MG/3ML SOLN Take 3 mLs 4 (four) times daily as needed by nebulization (asthma.). 10/22/17  Yes Enid Baas, MD  ondansetron (ZOFRAN ODT) 4 MG disintegrating tablet Take 1 tablet (4 mg total) by mouth every 8 (eight) hours as needed for nausea or vomiting. 07/17/18  Yes Darci Current, MD  oxyCODONE-acetaminophen (PERCOCET) 5-325 MG tablet Take 1 tablet by mouth every 8 (eight) hours as needed. 07/17/18  Yes Darci Current, MD    Family History  Problem Relation Age of Onset  . Cancer Father 19       Colon cancer  . Asthma Other      Social  History   Tobacco Use  . Smoking status: Never Smoker  . Smokeless tobacco: Never Used  Substance Use Topics  . Alcohol use: Not Currently  . Drug use: No    Allergies as of 07/20/2018 - Review Complete 07/20/2018  Allergen Reaction Noted  . Penicillins Swelling and Rash 06/07/2015  . Sulfasalazine Rash and Swelling 06/07/2015  . Sulfa antibiotics Swelling and Rash 06/07/2015    Review of Systems:    All systems reviewed and negative except where noted in HPI.   Physical Exam:  BP 128/85   Pulse 89   Ht 5' 1.5" (1.562 m)   Wt 247 lb (112 kg)   LMP 07/16/2018   BMI 45.91 kg/m  Patient's last menstrual period was 07/16/2018. Psych:  Alert and cooperative.  Normal mood and affect. General:   Alert,  Well-developed, well-nourished, pleasant and cooperative in NAD Head:  Normocephalic and atraumatic. Eyes:  Sclera clear, no icterus.   Conjunctiva pink. Ears:  Normal auditory acuity. Nose:  No deformity, discharge, or lesions. Mouth:  No deformity or lesions,oropharynx pink & moist. Neck:  Supple; no masses or thyromegaly. Lungs:  Respirations even and unlabored.  Clear throughout to auscultation.   No wheezes, crackles, or rhonchi. No acute distress. Heart:  Regular rate and rhythm; no murmurs, clicks, rubs, or gallops. Abdomen:  Normal bowel sounds.  No bruits.  Soft, non-tender and non-distended without masses, hepatosplenomegaly or hernias noted.  No guarding or rebound tenderness.    Msk:  Symmetrical without gross deformities. Good, equal movement & strength bilaterally. Pulses:  Normal pulses noted. Extremities:  No clubbing or edema.  No cyanosis. Neurologic:  Alert and oriented x3;  grossly normal neurologically. Skin:  Intact without significant lesions or rashes. No jaundice. Lymph Nodes:  No significant cervical adenopathy. Psych:  Alert and cooperative. Normal mood and affect.   Labs: CBC    Component Value Date/Time   WBC 11.4 (H) 07/16/2018 1537   RBC 4.71 07/16/2018 1537   HGB 14.9 07/16/2018 1537   HGB 13.1 04/18/2014 2353   HCT 42.8 07/16/2018 1537   HCT 39.2 04/18/2014 2353   PLT 372 07/16/2018 1537   PLT 309 04/18/2014 2353   MCV 90.9 07/16/2018 1537   MCV 91 04/18/2014 2353   MCH 31.7 07/16/2018 1537   MCHC 34.9 07/16/2018 1537   RDW 13.7 07/16/2018 1537   RDW 14.9 (H) 04/18/2014 2353   LYMPHSABS 3.0 05/07/2018 0843   LYMPHSABS 4.2 (H) 12/09/2013 1321   MONOABS 0.8 05/07/2018 0843   MONOABS 0.8 12/09/2013 1321   EOSABS 0.4 05/07/2018 0843   EOSABS 0.2 12/09/2013 1321   BASOSABS 0.1 05/07/2018 0843   BASOSABS 0.1 12/09/2013 1321   CMP     Component Value Date/Time   NA 137 07/16/2018 1537   NA 138  04/18/2014 2353   K 4.2 07/16/2018 1537   K 3.6 04/18/2014 2353   CL 107 07/16/2018 1537   CL 108 (H) 04/18/2014 2353   CO2 21 (L) 07/16/2018 1537   CO2 25 04/18/2014 2353   GLUCOSE 114 (H) 07/16/2018 1537   GLUCOSE 100 (H) 04/18/2014 2353   BUN 9 07/16/2018 1537   BUN 9 04/18/2014 2353   CREATININE 0.60 07/16/2018 1537   CREATININE 0.82 04/18/2014 2353   CALCIUM 8.9 07/16/2018 1537   CALCIUM 8.5 04/18/2014 2353   PROT 8.0 07/16/2018 1537   ALBUMIN 4.3 07/16/2018 1537   AST 33 07/16/2018 1537   ALT 28 07/16/2018 1537   ALKPHOS  35 (L) 07/16/2018 1537   BILITOT 0.6 07/16/2018 1537   GFRNONAA >60 07/16/2018 1537   GFRNONAA >60 04/18/2014 2353   GFRAA >60 07/16/2018 1537   GFRAA >60 04/18/2014 2353    Imaging Studies: Ct Abdomen Pelvis W Contrast  Result Date: 07/17/2018 CLINICAL DATA:  RIGHT upper quadrant pain radiating to shoulders. Diarrhea and vomiting beginning yesterday. History of appendectomy. EXAM: CT ABDOMEN AND PELVIS WITH CONTRAST TECHNIQUE: Multidetector CT imaging of the abdomen and pelvis was performed using the standard protocol following bolus administration of intravenous contrast. CONTRAST:  100mL OMNIPAQUE IOHEXOL 300 MG/ML  SOLN COMPARISON:  CT abdomen and pelvis May 07, 2018 FINDINGS: LOWER CHEST: Lung bases are clear. Included heart size is normal. No pericardial effusion. HEPATOBILIARY: Liver is diffusely hypodense compatible with steatosis. Focal fatty sparing about the gallbladder fossa. Normal gallbladder. PANCREAS: Normal. SPLEEN: Normal. ADRENALS/URINARY TRACT: Kidneys are orthotopic, demonstrating symmetric enhancement. No nephrolithiasis, hydronephrosis or solid renal masses. The unopacified ureters are normal in course and caliber. Urinary bladder is decompressed and unremarkable. Normal adrenal glands. STOMACH/BOWEL: The stomach, small and large bowel are normal in course and caliber without inflammatory changes. Status post appendectomy. VASCULAR/LYMPHATIC:  Aortoiliac vessels are normal in course and caliber. Trace calcific atherosclerosis. No lymphadenopathy by CT size criteria. REPRODUCTIVE: Normal. Air density in the vagina compatible with tampon. OTHER: No intraperitoneal free fluid or free air. MUSCULOSKELETAL: Nonacute. Small fat containing umbilical hernia. Mild sacroiliac osteoarthrosis. IMPRESSION: 1. No acute intra-abdominal or pelvic process. 2. Hepatic steatosis. Aortic Atherosclerosis (ICD10-I70.0). Electronically Signed   By: Awilda Metroourtnay  Bloomer M.D.   On: 07/17/2018 02:28   [place] Abdomen Limited Ruq  Result Date: 07/16/2018 CLINICAL DATA:  Abdominal pain for 1 day. EXAM: ULTRASOUND ABDOMEN LIMITED RIGHT UPPER QUADRANT COMPARISON:  Ultrasound 05/07/2018.  CT 05/07/2018. FINDINGS: Gallbladder: No gallstones or wall thickening visualized. No sonographic Murphy sign noted by sonographer. Common bile duct: Diameter: 4 mm Liver: The hepatic echogenicity is diffusely increased, consistent with steatosis. No focal lesion identified. Portal vein is patent on color Doppler imaging with normal direction of blood flow towards the liver. IMPRESSION: No acute right upper quadrant abdominal findings. Hepatic steatosis. Electronically Signed   By: Carey BullocksWilliam  Veazey M.D.   On: 07/16/2018 18:39    Assessment and Plan:   Cassandra Cortez is a 37 y.o. y/o female has been referred for abdominal pain, nausea vomiting and diarrhea  Labs and imaging are reassuring.  Imaging revealed hepatic steatosis Lab work does not reveal any anemia.  White count mildly elevated on lab work. Therefore symptoms likely due to gastroenteritis No prior stool testing Will obtain infectious work-up with stool testing We will also obtain pancreatic elastase, fecal calprotectin We will also obtain H. pylori serology and stool testing, as patient is on H2 RA which can lead to false negative result on stool testing  She also uses marijuana, 2-3 times a week She was educated about the  cyclical vomiting syndrome and its association with marijuana and she verbalized understanding.  She was encouraged to abstain from marijuana use and other drugs.  Gallbladder reported to be normal, with no gallstones, and liver enzymes normal, and clinical symptoms not consistent with biliary colic at this time  No indication for colonoscopy or EGD at this time given normal hemoglobin, and possible gastroenteritis leading to symptoms Patient does qualify for high risk screening, which will start at 37 years of age for her  Patient asked to avoid NSAIDs  Finding of fatty liver on imaging discussed  with patient Diet, weight loss, and exercise encouraged along with avoiding hepatotoxic drugs including alcohol Risk of progression to cirrhosis if above measures are not instituted were discussed as well, and patient verbalized understanding  Patient recommended to establish primary care provider as well    Dr Cassandra Cortez

## 2018-07-20 NOTE — Patient Instructions (Signed)
F/U in 6 months Need a primary care physician-see list provided  Cyclic Vomiting Syndrome, Adult Cyclic vomiting syndrome (CVS) is a condition that causes episodes of severe nausea and vomiting. It can last for hours or even days. Attacks may occur several times a month or several times a year. Between episodes of CVS, you may be otherwise healthy. CVS is also called abdominal migraine. What are the causes? The cause of this condition is not known. Although many of the episodes can happen for no obvious reason, you may have specific CVS triggers. Episodes may be triggered by:  An infection, especially colds and the flu.  Emotional stress, including excitement or anxiety about upcoming events, such as school, parties, or travel.  Certain foods or beverages, such as chocolate, cheese, alcohol, and food additives.  Motion sickness.  Eating a large meal before bed.  Being very tired.  Being too hot.  What increases the risk? You are more likely to develop this condition if:  You get migraine headaches.  You have a family history of CVS or migraine headaches.  What are the signs or symptoms? Symptoms tend to happen at the same time of day, and each episode tends to last about the same amount of time. Symptoms commonly start at night or when you wake up. Many people have warning signs (prodrome) before an episode, which may include slight nausea, sweating, and pale skin (pallor). The most common symptoms of a CVS attack include:  Severe vomiting. Vomiting may happen every 5-15 minutes.  Severe nausea.  Gagging (retching).  Other symptoms may include:  Headache.  Dizziness.  Sensitivity to light.  Extreme thirst.  Abdominal pain. This can be severe.  Loose stools or diarrhea.  Fever.  Pale skin (pallor), especially on the face.  Weakness.  Exhaustion.  Sleepiness after a CVS episode.  Dehydration. This can cause: ? Thirst. ? Dry mouth. ? Decreased  urination. ? Fatigue.  How is this diagnosed? This condition may be diagnosed based on your symptoms, medical history, and family history of CVS or migraine. Your health care provider will ask whether you have had:  Episodes of severe nausea and vomiting that have happened a total of 5 or more times, or 3 or more times in the past 6 months.  Episodes that last for 1 hour or more, and occur 1 week apart or farther apart.  Episodes that are similar each time.  Normal health between episodes.  Your health care provider will also do a physical exam. To rule out other conditions, you may have tests, such as:  Blood tests.  Urine tests.  Imaging tests.  How is this treated? There is no cure for this condition, but treatment can help manage or prevent CVS episodes. Work with your health care provider to find the best treatment for you. Treatment may include:  Avoiding stress and CVS triggers.  Eating smaller, more frequent meals.  Taking medicines, such as: ? Over-the-counter pain medicine. ? Anti-nausea medicines. ? Antacids. ? Antihistamines. ? Medicines for migraines. ? Antidepressants. ? Antibiotics.  Severe nausea and vomiting may require you to stay at the hospital. You may need IV fluids to prevent or treat dehydration. Follow these instructions at home: During an episode  Take over-the-counter and prescription medicines only as told by your health care provider.  Stay in bed and rest in a dark, quiet room. After an episode  Drink an oral rehydration solution (ORS), if directed by your health care provider. This is a  drink that helps you replace fluids and the salts and minerals in your blood (electrolytes). It can be found at pharmacies and retail stores.  Drink small amounts of clear fluids slowly and gradually add more. ? Drink clear fluids such as water or fruit juice that has water added (is diluted). You may also eat low-calorie popsicles. ? Avoid drinking  fluids that contain a lot of sugar or caffeine, such as sports drinks and soda.  Eat soft foods in small amounts every 3-4 hours. Eat your regular diet, but avoid spicy or fatty foods, such as french fries and pizza. General instructions  Monitor your condition for any changes.  If you were prescribed an antibiotic medicine, take it as told by your health care provider. Do not stop taking the antibiotic even if you start to feel better.  Keep track of your attacks and symptoms, and pay attention to any triggers. Avoid those triggers when you can.  Keep all follow-up visits as told by your health care provider. This is important. Contact a health care provider if:  Your condition gets worse.  You cannot drink fluids without vomiting.  You have pain and trouble swallowing after an episode. Get help right away if:  You have blood in your vomit.  Your vomit looks like coffee grounds.  You have stools that are bloody or black, or stools that look like tar.  You have signs of dehydration, such as: ? Sunken eyes. ? Not making tears while crying. ? Very dry mouth. ? Cracked lips. ? Decreased urine production. ? Dark urine. Urine may be the color of tea. ? Weakness. ? Sleepiness. Summary  Cyclic vomiting syndrome (CVS) causes episodes of severe nausea and vomiting that can last for hours or even days.  Vomiting and diarrhea can make you feel weak and can lead to dehydration. If you notice signs of dehydration, call your health care provider right away.  Treatment can help you manage or prevent CVS episodes. Work with your health care provider to find the best treatment for you.  Keep all follow-up visits as told by your health care provider. This is important. This information is not intended to replace advice given to you by your health care provider. Make sure you discuss any questions you have with your health care provider. Document Released: 01/16/2017 Document Revised:  01/16/2017 Document Reviewed: 01/16/2017 Elsevier Interactive Patient Education  2018 [employer]  Fatty Liver Fatty liver, also called hepatic steatosis or steatohepatitis, is a condition in which too much fat has built up in your liver cells. The liver removes harmful substances from your bloodstream. It produces fluids your body needs. It also helps your body use and store energy from the food you eat. In many cases, fatty liver does not cause symptoms or problems. It is often diagnosed when tests are being done for other reasons. However, over time, fatty liver can cause inflammation that may lead to more serious liver problems, such as scarring of the liver (cirrhosis). What are the causes? Causes of fatty liver may include:  Drinking too much alcohol.  Poor nutrition.  Obesity.  Cushing syndrome.  Diabetes.  Hyperlipidemia.  Pregnancy.  Certain drugs.  Poisons.  Some viral infections.  What increases the risk? You may be more likely to develop fatty liver if you:  Abuse alcohol.  Are pregnant.  Are overweight.  Have diabetes.  Have hepatitis.  Have a high triglyceride level.  What are the signs or symptoms? Fatty liver often does  not cause any symptoms. In cases where symptoms develop, they can include:  Fatigue.  Weakness.  Weight loss.  Confusion.  Abdominal pain.  Yellowing of your skin and the white parts of your eyes (jaundice).  Nausea and vomiting.  How is this diagnosed? Fatty liver may be diagnosed by:  Physical exam and medical history.  Blood tests.  Imaging tests, such as an ultrasound, CT scan, or MRI.  Liver biopsy. A small sample of liver tissue is removed using a needle. The sample is then looked at under a microscope.  How is this treated? Fatty liver is often caused by other health conditions. Treatment for fatty liver may involve medicines and lifestyle changes to manage conditions such as:  Alcoholism.  High  cholesterol.  Diabetes.  Being overweight or obese.  Follow these instructions at home:  Eat a healthy diet as directed by your health care provider.  Exercise regularly. This can help you lose weight and control your cholesterol and diabetes. Talk to your health care provider about an exercise plan and which activities are best for you.  Do not drink alcohol.  Take medicines only as directed by your health care provider. Contact a health care provider if: You have difficulty controlling your:  Blood sugar.  Cholesterol.  Alcohol consumption.  Get help right away if:  You have abdominal pain.  You have jaundice.  You have nausea and vomiting. This information is not intended to replace advice given to you by your health care provider. Make sure you discuss any questions you have with your health care provider. Document Released: 01/16/2006 Document Revised: 05/08/2016 Document Reviewed: 04/12/2014 Elsevier Interactive Patient Education  [employer].

## 2018-08-24 ENCOUNTER — Ambulatory Visit: Payer: Medicaid Other | Admitting: Gastroenterology

## 2018-09-17 ENCOUNTER — Emergency Department: Payer: Medicaid Other

## 2018-09-17 ENCOUNTER — Encounter: Payer: Self-pay | Admitting: Emergency Medicine

## 2018-09-17 ENCOUNTER — Inpatient Hospital Stay
Admission: EM | Admit: 2018-09-17 | Discharge: 2018-09-20 | DRG: 392 | Disposition: A | Discharge status: Home / Self Care | Payer: Medicaid Other | Attending: Internal Medicine | Admitting: Internal Medicine

## 2018-09-17 DIAGNOSIS — Z800 Family history of malignant neoplasm of digestive organs: Secondary | ICD-10-CM | POA: Diagnosis not present

## 2018-09-17 DIAGNOSIS — Z79899 Other long term (current) drug therapy: Secondary | ICD-10-CM

## 2018-09-17 DIAGNOSIS — Z882 Allergy status to sulfonamides status: Secondary | ICD-10-CM

## 2018-09-17 DIAGNOSIS — R748 Abnormal levels of other serum enzymes: Secondary | ICD-10-CM | POA: Diagnosis present

## 2018-09-17 DIAGNOSIS — K219 Gastro-esophageal reflux disease without esophagitis: Secondary | ICD-10-CM | POA: Diagnosis present

## 2018-09-17 DIAGNOSIS — J45909 Unspecified asthma, uncomplicated: Secondary | ICD-10-CM | POA: Diagnosis present

## 2018-09-17 DIAGNOSIS — R1011 Right upper quadrant pain: Secondary | ICD-10-CM | POA: Diagnosis present

## 2018-09-17 DIAGNOSIS — Z880 Allergy status to penicillin: Secondary | ICD-10-CM

## 2018-09-17 DIAGNOSIS — F1290 Cannabis use, unspecified, uncomplicated: Secondary | ICD-10-CM | POA: Diagnosis present

## 2018-09-17 DIAGNOSIS — K625 Hemorrhage of anus and rectum: Secondary | ICD-10-CM | POA: Diagnosis present

## 2018-09-17 DIAGNOSIS — Z825 Family history of asthma and other chronic lower respiratory diseases: Secondary | ICD-10-CM | POA: Diagnosis not present

## 2018-09-17 DIAGNOSIS — R319 Hematuria, unspecified: Secondary | ICD-10-CM | POA: Diagnosis not present

## 2018-09-17 LAB — COMPREHENSIVE METABOLIC PANEL
ALBUMIN: 4.4 g/dL (ref 3.5–5.0)
ALT: 36 U/L (ref 0–44)
AST: 37 U/L (ref 15–41)
Alkaline Phosphatase: 41 U/L (ref 38–126)
Anion gap: 8 (ref 5–15)
BUN: 10 mg/dL (ref 6–20)
CHLORIDE: 106 mmol/L (ref 98–111)
CO2: 24 mmol/L (ref 22–32)
Calcium: 9 mg/dL (ref 8.9–10.3)
Creatinine, Ser: 0.85 mg/dL (ref 0.44–1.00)
GFR calc Af Amer: >60 mL/min (ref >60)
GFR calc non Af Amer: >60 mL/min (ref >60)
GLUCOSE: 132 mg/dL — AB (ref 70–99)
POTASSIUM: 3.6 mmol/L (ref 3.5–5.1)
Sodium: 138 mmol/L (ref 135–145)
Total Bilirubin: 0.5 mg/dL (ref 0.3–1.2)
Total Protein: 7.8 g/dL (ref 6.5–8.1)

## 2018-09-17 LAB — URINALYSIS, COMPLETE (UACMP) WITH MICROSCOPIC
BACTERIA UA: NONE SEEN
Bilirubin Urine: NEGATIVE
Glucose, UA: NEGATIVE mg/dL
Ketones, ur: NEGATIVE mg/dL
Leukocytes, UA: NEGATIVE
Nitrite: NEGATIVE
Protein, ur: NEGATIVE mg/dL
SPECIFIC GRAVITY, URINE: 1.029 (ref 1.005–1.030)
pH: 6 (ref 5.0–8.0)

## 2018-09-17 LAB — CBC
HEMATOCRIT: 44.5 % (ref 35.0–47.0)
Hemoglobin: 14.6 g/dL (ref 12.0–16.0)
MCH: 30.4 pg (ref 26.0–34.0)
MCHC: 32.7 g/dL (ref 32.0–36.0)
MCV: 93 fL (ref 80.0–100.0)
Platelets: 411 10*3/uL (ref 150–440)
RBC: 4.78 MIL/uL (ref 3.80–5.20)
RDW: 14 % (ref 11.5–14.5)
WBC: 10.7 10*3/uL (ref 3.6–11.0)

## 2018-09-17 LAB — LIPASE, BLOOD: Lipase: 138 U/L — ABNORMAL HIGH (ref 11–51)

## 2018-09-17 MED ORDER — HYDROMORPHONE HCL 1 MG/ML IJ SOLN
INTRAMUSCULAR | Status: AC
  Administered 2018-09-17: 0.5 mg via INTRAVENOUS
  Filled 2018-09-17: qty 1

## 2018-09-17 MED ORDER — ACETAMINOPHEN 325 MG PO TABS
650.0000 mg | ORAL_TABLET | Freq: Four times a day (QID) | ORAL | Status: DC | PRN
  Administered 2018-09-17: 650 mg via ORAL
  Filled 2018-09-17: qty 2

## 2018-09-17 MED ORDER — ONDANSETRON HCL 4 MG PO TABS: 4.0000 mg | ORAL_TABLET | Freq: Four times a day (QID) | ORAL | Status: DC | PRN

## 2018-09-17 MED ORDER — ALBUTEROL SULFATE (2.5 MG/3ML) 0.083% IN NEBU: 2.5000 mg | INHALATION_SOLUTION | RESPIRATORY_TRACT | Status: DC | PRN

## 2018-09-17 MED ORDER — MORPHINE SULFATE (PF) 4 MG/ML IV SOLN
4.0000 mg | Freq: Once | INTRAVENOUS | Status: AC
  Administered 2018-09-17: 4 mg via INTRAVENOUS

## 2018-09-17 MED ORDER — HYDROCODONE-ACETAMINOPHEN 7.5-325 MG PO TABS
1.0000 | ORAL_TABLET | Freq: Four times a day (QID) | ORAL | Status: DC | PRN
  Administered 2018-09-17 – 2018-09-20 (×8): 1 via ORAL
  Filled 2018-09-17 (×8): qty 1

## 2018-09-17 MED ORDER — POLYETHYLENE GLYCOL 3350 17 G PO PACK
17.0000 g | PACK | Freq: Every day | ORAL | Status: DC | PRN
  Administered 2018-09-18: 17 g via ORAL
  Filled 2018-09-17: qty 1

## 2018-09-17 MED ORDER — HYDROMORPHONE HCL 1 MG/ML IJ SOLN
1.0000 mg | Freq: Once | INTRAMUSCULAR | Status: AC
  Administered 2018-09-17: 1 mg via INTRAVENOUS
  Filled 2018-09-17: qty 1

## 2018-09-17 MED ORDER — MORPHINE SULFATE (PF) 4 MG/ML IV SOLN
INTRAVENOUS | Status: AC
  Filled 2018-09-17: qty 1

## 2018-09-17 MED ORDER — HYDROMORPHONE HCL 1 MG/ML IJ SOLN
0.5000 mg | Freq: Once | INTRAMUSCULAR | Status: AC
  Administered 2018-09-17: 0.5 mg via INTRAVENOUS

## 2018-09-17 MED ORDER — ONDANSETRON HCL 4 MG/2ML IJ SOLN
4.0000 mg | Freq: Once | INTRAMUSCULAR | Status: AC
  Administered 2018-09-17: 4 mg via INTRAVENOUS
  Filled 2018-09-17: qty 2

## 2018-09-17 MED ORDER — ACETAMINOPHEN 650 MG RE SUPP: 650.0000 mg | Freq: Four times a day (QID) | RECTAL | Status: DC | PRN

## 2018-09-17 MED ORDER — OXYCODONE-ACETAMINOPHEN 5-325 MG PO TABS: 1.0000 | ORAL_TABLET | ORAL | Status: DC | PRN

## 2018-09-17 MED ORDER — ALBUTEROL SULFATE HFA 108 (90 BASE) MCG/ACT IN AERS: 1.0000 | INHALATION_SPRAY | Freq: Four times a day (QID) | RESPIRATORY_TRACT | Status: DC | PRN

## 2018-09-17 MED ORDER — LACTATED RINGERS IV SOLN
INTRAVENOUS | Status: DC
  Administered 2018-09-17 – 2018-09-20 (×8): via INTRAVENOUS

## 2018-09-17 MED ORDER — ENOXAPARIN SODIUM 40 MG/0.4ML ~~LOC~~ SOLN
40.0000 mg | SUBCUTANEOUS | Status: DC
  Administered 2018-09-17 – 2018-09-19 (×2): 40 mg via SUBCUTANEOUS
  Filled 2018-09-17 (×2): qty 0.4

## 2018-09-17 MED ORDER — ONDANSETRON HCL 4 MG/2ML IJ SOLN
4.0000 mg | Freq: Four times a day (QID) | INTRAMUSCULAR | Status: DC | PRN
  Administered 2018-09-17 – 2018-09-20 (×2): 4 mg via INTRAVENOUS
  Filled 2018-09-17 (×2): qty 2

## 2018-09-17 NOTE — ED Triage Notes (Signed)
Pt tearful in triage. Pt reports has had this issue intermittently for several months but it continues to get worse. Pt reports every time she eats about 20-30 minutes later she gets severe abdominal pain upper and it takes her breath. Pt states that she followed up with GI like she as supposed to but they could not find anything. Pt reports this attack started last night and has gotten worse and the pain goes into her right shoulder as well.

## 2018-09-17 NOTE — Consult Note (Signed)
Patient ID: Cassandra Cortez, female   DOB: Mar 06, 1981, 37 y.o.   MRN: [id number]  HPI Cassandra Cortez is a 37 y.o. female seen in consultation at the request of Dr. Elpidio Anis for RUQ pain.  She reports that she has been having this pain for about 5 to 6 months.  She describes that the pain exacerbated yesterday and is worse after 30 minutes of having anything to eat.  She experienced right upper quadrant pain that is sharp and moderate to severe in intensity.  She does have nausea vomiting and diarrhea. She has been seen by Dr. Maximino Greenland was told that the symptoms were consistent with irritable bowel. She has had 2 CT scan and 2 U/S that have personally reviewed showing no gallstones normal common bile duct.  Liver steatosis. No evidence of acute intra-abdominal abnormality. White count is 10 and H&H is stable.  MP is completely normal and lipase is slightly elevated. She is able to perform more than 4 METS. Prior appendectomy and C section  HPI  Past Medical History:  Diagnosis Date  . Asthma     Past Surgical History:  Procedure Laterality Date  . APPENDECTOMY    . CESAREAN SECTION    . LEEP      Family History  Problem Relation Age of Onset  . Cancer Father 64       Colon cancer  . Asthma Other     Social History Social History   Tobacco Use  . Smoking status: Never Smoker  . Smokeless tobacco: Never Used  Substance Use Topics  . Alcohol use: Not Currently  . Drug use: No    Allergies  Allergen Reactions  . Penicillins Swelling and Rash    Has patient had a PCN reaction causing immediate rash, facial/tongue/throat swelling, SOB or lightheadedness with hypotension: Yes Has patient had a PCN reaction causing severe rash involving mucus membranes or skin necrosis: No Has patient had a PCN reaction that required hospitalization No Has patient had a PCN reaction occurring within the last 10 years: No If all of the above answers are "NO", then may proceed with Cephalosporin use.   . Sulfasalazine Rash and Swelling  . Sulfa Antibiotics Swelling and Rash    Current Facility-Administered Medications  Medication Dose Route Frequency Provider Last Rate Last Dose  . acetaminophen (TYLENOL) tablet 650 mg  650 mg Oral Q6H PRN Milagros Loll, MD   650 mg at 09/17/18 2229   Or  . acetaminophen (TYLENOL) suppository 650 mg  650 mg Rectal Q6H PRN Sudini, Wardell Heath, MD      . albuterol (PROVENTIL) (2.5 MG/3ML) 0.083% nebulizer solution 2.5 mg  2.5 mg Nebulization Q2H PRN Sudini, Srikar, MD      . enoxaparin (LOVENOX) injection 40 mg  40 mg Subcutaneous Q24H Milagros Loll, MD   40 mg at 09/17/18 2231  . HYDROcodone-acetaminophen (NORCO) 7.5-325 MG per tablet 1 tablet  1 tablet Oral Q6H PRN Milagros Loll, MD   1 tablet at 09/17/18 1946  . lactated ringers infusion   Intravenous Continuous Milagros Loll, MD 100 mL/hr at 09/17/18 1900    . morphine 4 MG/ML injection           . ondansetron (ZOFRAN) tablet 4 mg  4 mg Oral Q6H PRN Milagros Loll, MD       Or  . ondansetron (ZOFRAN) injection 4 mg  4 mg Intravenous Q6H PRN Milagros Loll, MD   4 mg at 09/17/18 1725  . polyethylene glycol (MIRALAX /  GLYCOLAX) packet 17 g  17 g Oral Daily PRN Milagros Loll, MD         Review of Systems Full ROS  was asked and was negative except for the information on the HPI  Physical Exam Blood pressure (!) 151/103, pulse 65, temperature 98 F (36.7 C), temperature source Axillary, resp. rate (!) 21, height 5' 1.5" (1.562 m), weight 108.9 kg, last menstrual period 09/14/2018, SpO2 100 %. CONSTITUTIONAL: Obese female in NAD EYES: Pupils are equal, round, and reactive to light, Sclera are non-icteric. EARS, NOSE, MOUTH AND THROAT: The oropharynx is clear. The oral mucosa is pink and moist. Hearing is intact to voice. LYMPH NODES:  Lymph nodes in the neck are normal. RESPIRATORY:  Lungs are clear. There is normal respiratory effort, with equal breath sounds bilaterally, and without pathologic use  of accessory muscles. CARDIOVASCULAR: Heart is regular without murmurs, gallops, or rubs. GI: The abdomen is  soft, Mild TTP RUQ, no peritonitis and no Murphy.. There are no palpable masses. There is no hepatosplenomegaly. There are normal bowel sounds in all quadrants. GU: Rectal deferred.   MUSCULOSKELETAL: Normal muscle strength and tone. No cyanosis or edema.   SKIN: Turgor is good and there are no pathologic skin lesions or ulcers. NEUROLOGIC: Motor and sensation is grossly normal. Cranial nerves are grossly intact. PSYCH:  Oriented to person, place and time. Affect is normal.  Data Reviewed  I have personally reviewed the patient's imaging, laboratory findings and medical records.    Assessment/Plan 37 year old female with right upper quadrant pain.  Certainly can be caused by biliary dyskinesia.  Currently no evidence of acute cholecystitis on clinical findings or previous work-up.  I agree with symptomatic management and HIDA scan.  If HIDA has scan is negative may entertain consultation with GI. DisCussed with the patient in detail about biliary dyskinesia on potential cystectomy if in fact we found a dysfunction of the gallbladder. I will continue to follow her and review her HIDA scan tomorrow.  No need for emergent surgical procedures at this time  Sterling Big, MD FACS General Surgeon 09/17/2018, 11:23 PM

## 2018-09-17 NOTE — ED Notes (Signed)
Pt understands not to eat or drink anything after midnight.

## 2018-09-17 NOTE — ED Provider Notes (Signed)
[facility] Emergency Department Provider Note        ____________________________________________   First MD Initiated Contact with Patient 09/17/18 1224     (approximate)  I have reviewed the triage vital signs and the nursing notes.   HISTORY  Chief Complaint Abdominal Pain   HPI Cassandra Cortez is a 37 y.o. female who complains of right upper quadrant pain.  She says it comes on 20 to 30 minutes after she eats.  This attack started last night and is gotten worse.  The pain goes into her right shoulder as well.  Seems to be associated with fatty foods.  She is had 2- ultrasounds and 2- CTs of the abdomen.  Is following up with gastroenterology but she cannot remember the person's name.  Patient has an elevated lipase today which is higher than its ever been.  We will try an ultrasound again.  Pain is severe.  Is made worse with movement or deep breathing or palpation.  Seems to be brought on by fatty foods as they mention.   Past Medical History:  Diagnosis Date  . Asthma     Patient Active Problem List   Diagnosis Date Noted  . Seizure (HCC) 07/16/2018  . Asthma exacerbation 10/28/2015  . Acute respiratory distress 10/28/2015  . Asthma 10/28/2015    Past Surgical History:  Procedure Laterality Date  . APPENDECTOMY    . CESAREAN SECTION    . LEEP      Prior to Admission medications   Medication Sig Start Date End Date Taking? Authorizing Provider  albuterol (PROVENTIL HFA;VENTOLIN HFA) 108 (90 Base) MCG/ACT inhaler Inhale 1-2 puffs every 6 (six) hours as needed into the lungs for wheezing or shortness of breath.   Yes [provider]  famotidine (PEPCID) 20 MG tablet Take 1 tablet (20 mg total) by mouth 2 (two) times daily. Patient not taking: Reported on 09/17/2018 07/17/18   Darci Current, MD  ipratropium-albuterol (DUONEB) 0.5-2.5 (3) MG/3ML SOLN Take 3 mLs 4 (four) times daily as needed by nebulization (asthma.). Patient not  taking: Reported on 09/17/2018 10/22/17   Enid Baas, MD  ondansetron (ZOFRAN ODT) 4 MG disintegrating tablet Take 1 tablet (4 mg total) by mouth every 8 (eight) hours as needed for nausea or vomiting. Patient not taking: Reported on 09/17/2018 07/17/18   Darci Current, MD  oxyCODONE-acetaminophen (PERCOCET) 5-325 MG tablet Take 1 tablet by mouth every 8 (eight) hours as needed. Patient not taking: Reported on 09/17/2018 07/17/18   Darci Current, MD    Allergies Penicillins; Sulfasalazine; and Sulfa antibiotics  Family History  Problem Relation Age of Onset  . Cancer Father 82       Colon cancer  . Asthma Other     Social History Social History   Tobacco Use  . Smoking status: Never Smoker  . Smokeless tobacco: Never Used  Substance Use Topics  . Alcohol use: Not Currently  . Drug use: No    Review of Systems  Constitutional: No fever/chills Eyes: No visual changes. ENT: No sore throat. Cardiovascular: Denies chest pain. Respiratory: Denies shortness of breath. Gastrointestinal:  abdominal pain.   nausea, no vomiting.  No diarrhea.  No constipation. Genitourinary: Negative for dysuria. Musculoskeletal: Negative for back pain. Skin: Negative for rash. Neurological: Negative for headaches, focal weakness   ____________________________________________   PHYSICAL EXAM:  VITAL SIGNS: ED Triage Vitals  Enc Vitals Group     BP 09/17/18 1113 (!) 145/95  Pulse Rate 09/17/18 1113 (!) 117     Resp 09/17/18 1113 (!) 24     Temp 09/17/18 1113 98.5 F (36.9 C)     Temp Source 09/17/18 1113 Oral     SpO2 09/17/18 1113 98 %     Weight 09/17/18 1113 240 lb (108.9 kg)     Height 09/17/18 1113 5' 1.5" (1.562 m)     Head Circumference --      Peak Flow --      Pain Score 09/17/18 1118 8     Pain Loc --      Pain Edu? --      Excl. in GC? --     Constitutional: Alert and oriented.  Tearful and in pain Eyes: Conjunctivae are normal.  Head: Atraumatic. Nose:  No congestion/rhinnorhea. Mouth/Throat: Mucous membranes are moist.  Oropharynx non-erythematous. Neck: No stridor.   Cardiovascular: Normal rate, regular rhythm. Grossly normal heart sounds.  Good peripheral circulation. Respiratory: Normal respiratory effort.  No retractions. Lungs CTAB. Gastrointestinal: Soft to palpation percussion in the right upper quadrant only. No distention. No abdominal bruits. No CVA tenderness. Musculoskeletal: No lower extremity tenderness nor edema.   Neurologic:  Normal speech and language. No gross focal neurologic deficits are appreciated.  Skin:  Skin is warm, dry and intact. No rash noted. Psychiatric: Mood and affect are normal. Speech and behavior are normal.  ____________________________________________   LABS (all labs ordered are listed, but only abnormal results are displayed)  Labs Reviewed  LIPASE, BLOOD - Abnormal; Notable for the following components:      Result Value   Lipase 138 (*)    All other components within normal limits  COMPREHENSIVE METABOLIC PANEL - Abnormal; Notable for the following components:   Glucose, Bld 132 (*)    All other components within normal limits  URINALYSIS, COMPLETE (UACMP) WITH MICROSCOPIC - Abnormal; Notable for the following components:   Color, Urine YELLOW (*)    APPearance CLEAR (*)    Hgb urine dipstick MODERATE (*)    All other components within normal limits  CBC   ____________________________________________  EKG   ____________________________________________  RADIOLOGY  ED MD interpretation:    Official radiology report(s): US Abdomen Complete  Result Date: 09/17/2018 CLINICAL DATA:  RIGHT upper quadrant pain intermittently for 5 months, elevated lipase EXAM: ABDOMEN ULTRASOUND COMPLETE COMPARISON:  CT abdomen and pelvis 07/17/2018 FINDINGS: Gallbladder: Well distended gallbladder. No gallstones, gallbladder wall thickening or pericholecystic fluid. Sonographer indicates focal  tenderness over the gallbladder with transducer pressure/sonographic Murphy sign. Common bile duct: Diameter: 5 mm diameter, normal Liver: Echogenic parenchyma, likely fatty infiltration though this can be seen with cirrhosis and certain infiltrative disorders. Patient demonstrated hepatic steatosis on prior CT. No focal hepatic mass or nodularity grossly identified, though assessment of intrahepatic detail is limited by sound attenuation. Portal vein patent with normal direction of blood flow towards the liver IVC: Normal appearance Pancreas: Body and proximal tail normal appearance, remainder obscured by bowel gas Spleen: Normal appearance, 6.6 cm length Right Kidney: Length: 11.8 cm. Normal morphology without mass or hydronephrosis. Left Kidney: Length: 10.7 cm. Normal morphology without mass or hydronephrosis. Abdominal aorta: Bifurcation obscured by bowel gas. Visualized aorta normal caliber. Other findings: No free fluid IMPRESSION: Probable fatty infiltration of liver. Incomplete pancreatic visualization. Sonographer indicated presence of a sonographic Murphy sign though no other gallbladder sonographic abnormalities are identified, significance uncertain. If there is persistent clinical concern for acute cholecystitis consider hepatobiliary imaging. Electronically Signed   By:  Ulyses Southward M.D.   On: 09/17/2018 15:04    ____________________________________________   PROCEDURES  Procedure(s) performed:   Procedures  Critical Care performed:   ____________________________________________   INITIAL IMPRESSION / ASSESSMENT AND PLAN / ED COURSE  Patient with several episodes of right upper quadrant pain negative ultrasound again today negative CT scans in the past her lipase is elevated.  Discussed this briefly with Dr. Daleen Squibb who thinks it could be microlithiasis which would not show up on the CT scan and could reflux up into the pancreas and cause pancreatitis.  I will order a HIDA scan we will  get the patient in the hospital medicine will admit her they can consult surgery, and gastroenterology          ____________________________________________   FINAL CLINICAL IMPRESSION(S) / ED DIAGNOSES  Final diagnoses:  Right upper quadrant abdominal pain     ED Discharge Orders    None       Note:  This document was prepared using Dragon voice recognition software and may include unintentional dictation errors.    Arnaldo Natal, MD 09/17/18 [phone]

## 2018-09-17 NOTE — ED Notes (Addendum)
Per nuclear pt needs to be NPO and narcotic-free after midnight tonight for imaging tomorrow.

## 2018-09-17 NOTE — ED Notes (Signed)
[place] RN, aware of bed assigned  

## 2018-09-17 NOTE — H&P (Signed)
[facility] [place] [facility]   PATIENT NAME: Cassandra Cortez    MR#:  [id number]  DATE OF BIRTH:  [birth year]  DATE OF ADMISSION:  09/17/2018  PRIMARY CARE PHYSICIAN: [facility]   REQUESTING/REFERRING PHYSICIAN: Dr. Darnelle Catalan  CHIEF COMPLAINT:   Chief Complaint  Patient presents with  . Abdominal Pain    HISTORY OF PRESENT ILLNESS:  Cassandra Cortez  is a 37 y.o. female with a known history of asthma presents to the emergency room due to recurrent right upper quadrant postprandial pain that has become constant since yesterday.  Patient has nausea and vomiting.  She has been dealing with this pain for many months and has had ED visits.  Pain happens after a big meal.  But has been constant which prompted her to come to the emergency room this time.  Liver function tests are normal.  Due to typical symptoms for gallbladder abnormality and ultrasound was checked of the right upper quadrant.  Gallbladder normal.  Patient continues to have severe pain is tearful.  Has received pain medications in the emergency room.  She mentions she had fever of 101 at home but afebrile here.  PAST MEDICAL HISTORY:   Past Medical History:  Diagnosis Date  . Asthma     PAST SURGICAL HISTORY:   Past Surgical History:  Procedure Laterality Date  . APPENDECTOMY    . CESAREAN SECTION    . LEEP      SOCIAL HISTORY:   Social History   Tobacco Use  . Smoking status: Never Smoker  . Smokeless tobacco: Never Used  Substance Use Topics  . Alcohol use: Not Currently    FAMILY HISTORY:   Family History  Problem Relation Age of Onset  . Cancer Father 61       Colon cancer  . Asthma Other     DRUG ALLERGIES:   Allergies  Allergen Reactions  . Penicillins Swelling and Rash    Has patient had a PCN reaction causing immediate rash, facial/tongue/throat swelling, SOB or lightheadedness with hypotension: Yes Has patient had a PCN reaction causing  severe rash involving mucus membranes or skin necrosis: No Has patient had a PCN reaction that required hospitalization No Has patient had a PCN reaction occurring within the last 10 years: No If all of the above answers are "NO", then may proceed with Cephalosporin use.  . Sulfasalazine Rash and Swelling  . Sulfa Antibiotics Swelling and Rash    REVIEW OF SYSTEMS:   Review of Systems  Constitutional: Positive for malaise/fatigue. Negative for chills, fever and weight loss.  HENT: Negative for hearing loss and nosebleeds.   Eyes: Negative for blurred vision, double vision and pain.  Respiratory: Negative for cough, hemoptysis, sputum production, shortness of breath and wheezing.   Cardiovascular: Negative for chest pain, palpitations, orthopnea and leg swelling.  Gastrointestinal: Positive for abdominal pain, nausea and vomiting. Negative for constipation and diarrhea.  Genitourinary: Negative for dysuria and hematuria.  Musculoskeletal: Negative for back pain, falls and myalgias.  Skin: Negative for rash.  Neurological: Negative for dizziness, tremors, sensory change, speech change, focal weakness, seizures and headaches.  Endo/Heme/Allergies: Does not bruise/bleed easily.  Psychiatric/Behavioral: Negative for depression and memory loss. The patient is not nervous/anxious.     MEDICATIONS AT HOME:   Prior to Admission medications   Medication Sig Start Date End Date Taking? Authorizing Provider  albuterol (PROVENTIL HFA;VENTOLIN HFA) 108 (90 Base) MCG/ACT inhaler Inhale 1-2 puffs every 6 (six)  hours as needed into the lungs for wheezing or shortness of breath.   Yes [provider]  famotidine (PEPCID) 20 MG tablet Take 1 tablet (20 mg total) by mouth 2 (two) times daily. Patient not taking: Reported on 09/17/2018 07/17/18   Darci Current, MD  ipratropium-albuterol (DUONEB) 0.5-2.5 (3) MG/3ML SOLN Take 3 mLs 4 (four) times daily as needed by nebulization (asthma.). Patient  not taking: Reported on 09/17/2018 10/22/17   Enid Baas, MD  ondansetron (ZOFRAN ODT) 4 MG disintegrating tablet Take 1 tablet (4 mg total) by mouth every 8 (eight) hours as needed for nausea or vomiting. Patient not taking: Reported on 09/17/2018 07/17/18   Darci Current, MD  oxyCODONE-acetaminophen (PERCOCET) 5-325 MG tablet Take 1 tablet by mouth every 8 (eight) hours as needed. Patient not taking: Reported on 09/17/2018 07/17/18   Darci Current, MD     VITAL SIGNS:  Blood pressure 137/76, pulse (!) 57, temperature 98.5 F (36.9 C), temperature source Oral, resp. rate (!) 7, height 5' 1.5" (1.562 m), weight 108.9 kg, last menstrual period 09/14/2018, SpO2 95 %.  PHYSICAL EXAMINATION:  Physical Exam  GENERAL:  37 y.o.-year-old patient lying in the bed with no acute distress.  EYES: Pupils equal, round, reactive to light and accommodation. No scleral icterus. Extraocular muscles intact.  HEENT: Head atraumatic, normocephalic. Oropharynx and nasopharynx clear. No oropharyngeal erythema, moist oral mucosa  NECK:  Supple, no jugular venous distention. No thyroid enlargement, no tenderness.  LUNGS: Normal breath sounds bilaterally, no wheezing, rales, rhonchi. No use of accessory muscles of respiration.  CARDIOVASCULAR: S1, S2 normal. No murmurs, rubs, or gallops.  ABDOMEN: Soft, right upper quadrant tenderness to, nondistended. Bowel sounds present. No organomegaly or mass.  EXTREMITIES: No pedal edema, cyanosis, or clubbing. + 2 pedal & radial pulses b/l.   NEUROLOGIC: Cranial nerves II through XII are intact. No focal Motor or sensory deficits appreciated b/l PSYCHIATRIC: The patient is alert and oriented x 3. Good affect.  SKIN: No obvious rash, lesion, or ulcer.   LABORATORY PANEL:   CBC Recent Labs  Lab 09/17/18 1119  WBC 10.7  HGB 14.6  HCT 44.5  PLT 411    ------------------------------------------------------------------------------------------------------------------  Chemistries  Recent Labs  Lab 09/17/18 1119  NA 138  K 3.6  CL 106  CO2 24  GLUCOSE 132*  BUN 10  CREATININE 0.85  CALCIUM 9.0  AST 37  ALT 36  ALKPHOS 41  BILITOT 0.5   ------------------------------------------------------------------------------------------------------------------  Cardiac Enzymes No results for input(s): TROPONINI in the last 168 hours. ------------------------------------------------------------------------------------------------------------------  RADIOLOGY:  US Abdomen Complete  Result Date: 09/17/2018 CLINICAL DATA:  RIGHT upper quadrant pain intermittently for 5 months, elevated lipase EXAM: ABDOMEN ULTRASOUND COMPLETE COMPARISON:  CT abdomen and pelvis 07/17/2018 FINDINGS: Gallbladder: Well distended gallbladder. No gallstones, gallbladder wall thickening or pericholecystic fluid. Sonographer indicates focal tenderness over the gallbladder with transducer pressure/sonographic Murphy sign. Common bile duct: Diameter: 5 mm diameter, normal Liver: Echogenic parenchyma, likely fatty infiltration though this can be seen with cirrhosis and certain infiltrative disorders. Patient demonstrated hepatic steatosis on prior CT. No focal hepatic mass or nodularity grossly identified, though assessment of intrahepatic detail is limited by sound attenuation. Portal vein patent with normal direction of blood flow towards the liver IVC: Normal appearance Pancreas: Body and proximal tail normal appearance, remainder obscured by bowel gas Spleen: Normal appearance, 6.6 cm length Right Kidney: Length: 11.8 cm. Normal morphology without mass or hydronephrosis. Left Kidney: Length: 10.7 cm. Normal morphology  without mass or hydronephrosis. Abdominal aorta: Bifurcation obscured by bowel gas. Visualized aorta normal caliber. Other findings: No free fluid IMPRESSION:  Probable fatty infiltration of liver. Incomplete pancreatic visualization. Sonographer indicated presence of a sonographic Murphy sign though no other gallbladder sonographic abnormalities are identified, significance uncertain. If there is persistent clinical concern for acute cholecystitis consider hepatobiliary imaging. Electronically Signed   By: Ulyses Southward M.D.   On: 09/17/2018 15:04     IMPRESSION AND PLAN:   *Right upper quadrant pain which seems typical for gallbladder abnormality.  Murphy's positive.  But liver function tests and ultrasound normal.  Will check HIDA scan.  N.p.o. Will consult surgery.  Sent message to Dr. Everlene Farrier  *Mild elevation in lipase.  No signs of pancreatitis.  *Asthma.  Albuterol as needed  All the records are reviewed and case discussed with ED provider. Management plans discussed with the patient, family and they are in agreement.  CODE STATUS: FULL CODE  TOTAL TIME TAKING CARE OF THIS PATIENT: 40 minutes.   Molinda Bailiff Jamarkis Branam M.D on 09/17/2018 at 4:54 PM  Between 7am to 6pm - Pager - [phone]  After 6pm go to [email or web address] - password [email or web address] [facility]  [facility]  Office  [phone]  CC: Primary care physician; [facility]  Note: This dictation was prepared with Nurse, children's dictation along with smaller phrase technology. Any transcriptional errors that result from this process are unintentional.

## 2018-09-18 ENCOUNTER — Other Ambulatory Visit: Payer: Self-pay

## 2018-09-18 ENCOUNTER — Encounter: Payer: Self-pay | Admitting: *Deleted

## 2018-09-18 DIAGNOSIS — R319 Hematuria, unspecified: Secondary | ICD-10-CM

## 2018-09-18 DIAGNOSIS — R1011 Right upper quadrant pain: Principal | ICD-10-CM

## 2018-09-18 LAB — COMPREHENSIVE METABOLIC PANEL WITH GFR
ALT: 28 U/L (ref 0–44)
AST: 26 U/L (ref 15–41)
Albumin: 3.7 g/dL (ref 3.5–5.0)
Alkaline Phosphatase: 28 U/L — ABNORMAL LOW (ref 38–126)
Anion gap: 7 (ref 5–15)
BUN: 6 mg/dL (ref 6–20)
CO2: 26 mmol/L (ref 22–32)
Calcium: 8.5 mg/dL — ABNORMAL LOW (ref 8.9–10.3)
Chloride: 105 mmol/L (ref 98–111)
Creatinine, Ser: 0.71 mg/dL (ref 0.44–1.00)
GFR calc Af Amer: >60 mL/min (ref >60)
GFR calc non Af Amer: >60 mL/min (ref >60)
Glucose, Bld: 98 mg/dL (ref 70–99)
Potassium: 3.5 mmol/L (ref 3.5–5.1)
Sodium: 138 mmol/L (ref 135–145)
Total Bilirubin: 0.7 mg/dL (ref 0.3–1.2)
Total Protein: 6.7 g/dL (ref 6.5–8.1)

## 2018-09-18 LAB — CBC
HCT: 39.1 % (ref 35.0–47.0)
HEMOGLOBIN: 13 g/dL (ref 12.0–16.0)
MCH: 30.5 pg (ref 26.0–34.0)
MCHC: 33.3 g/dL (ref 32.0–36.0)
MCV: 91.6 fL (ref 80.0–100.0)
Platelets: 324 10*3/uL (ref 150–440)
RBC: 4.27 MIL/uL (ref 3.80–5.20)
RDW: 13.4 % (ref 11.5–14.5)
WBC: 10.5 10*3/uL (ref 3.6–11.0)

## 2018-09-18 MED ORDER — PANTOPRAZOLE SODIUM 40 MG PO TBEC
40.0000 mg | DELAYED_RELEASE_TABLET | Freq: Two times a day (BID) | ORAL | Status: DC
  Administered 2018-09-18 – 2018-09-20 (×4): 40 mg via ORAL
  Filled 2018-09-18 (×4): qty 1

## 2018-09-18 MED ORDER — PEG 3350-KCL-NA BICARB-NACL 420 G PO SOLR
4000.0000 mL | Freq: Once | ORAL | Status: AC
  Administered 2018-09-18: 4000 mL via ORAL
  Filled 2018-09-18: qty 4000

## 2018-09-18 MED ORDER — TECHNETIUM TC 99M MEBROFENIN IV KIT
5.7400 | PACK | Freq: Once | INTRAVENOUS | Status: AC | PRN
  Administered 2018-09-18: 5.74 via INTRAVENOUS

## 2018-09-18 MED ORDER — SODIUM CHLORIDE 0.9 % IV SOLN: INTRAVENOUS | Status: DC

## 2018-09-18 NOTE — Progress Notes (Signed)
CC: Abd pain Subjective: The scan personally reviewed and discussed with the patient no evidence of cholecystitis.CBC and CMP nml D/w Dr. Elpidio Anis  Objective: Vital signs in last 24 hours: Temp:  [97.9 F (36.6 C)-98 F (36.7 C)] 98 F (36.7 C) (10/04 2314) Pulse Rate:  [57-76] 65 (10/04 2314) Resp:  [7-21] 21 (10/04 2314) BP: (121-160)/(58-128) 160/90 (10/04 2349) SpO2:  [95 %-100 %] 100 % (10/04 2314)    Intake/Output from previous day: 10/04 0701 - 10/05 0700 In: 126.3 [I.V.:126.3] Out: -  Intake/Output this shift: No intake/output data recorded.  Physical exam:  NAD Abd: soft, mild TTP RUQ, no peritonitis Ext: well perfused and no edema  Lab Results: CBC  Recent Labs    09/17/18 1119 09/18/18 0406  WBC 10.7 10.5  HGB 14.6 13.0  HCT 44.5 39.1  PLT 411 324   BMET Recent Labs    09/17/18 1119 09/18/18 0406  NA 138 138  K 3.6 3.5  CL 106 105  CO2 24 26  GLUCOSE 132* 98  BUN 10 6  CREATININE 0.85 0.71  CALCIUM 9.0 8.5*   PT/INR No results for input(s): LABPROT, INR in the last 72 hours. ABG No results for input(s): PHART, HCO3 in the last 72 hours.  Invalid input(s): PCO2, PO2  Studies/Results: Nm Hepatobiliary Liver Func  Result Date: 09/18/2018 CLINICAL DATA:  37 year old female with right upper quadrant abdominal pain and questionable acalculous cholecystitis on ultrasound yesterday. EXAM: NUCLEAR MEDICINE HEPATOBILIARY IMAGING TECHNIQUE: Sequential images of the abdomen were obtained out to 60 minutes following intravenous administration of radiopharmaceutical. RADIOPHARMACEUTICALS:  4.7 mCi Tc-70m  Choletec IV COMPARISON:  Abdomen ultrasound 09/17/2018. CT Abdomen and Pelvis 07/17/2018. FINDINGS: Prompt radiotracer uptake by the liver and clearance of the blood pool. Gallbladder activity visible by 10 minutes and increases over the 1 hour duration of the study. CBD activity visible by 20-25 minutes. Small bowel activity visible by 30-35 minutes.  IMPRESSION: Normal, patent cystic duct and CBD.  No evidence of cholecystitis. Electronically Signed   By: Odessa Fleming M.D.   On: 09/18/2018 08:38   US Abdomen Complete  Result Date: 09/17/2018 CLINICAL DATA:  RIGHT upper quadrant pain intermittently for 5 months, elevated lipase EXAM: ABDOMEN ULTRASOUND COMPLETE COMPARISON:  CT abdomen and pelvis 07/17/2018 FINDINGS: Gallbladder: Well distended gallbladder. No gallstones, gallbladder wall thickening or pericholecystic fluid. Sonographer indicates focal tenderness over the gallbladder with transducer pressure/sonographic Murphy sign. Common bile duct: Diameter: 5 mm diameter, normal Liver: Echogenic parenchyma, likely fatty infiltration though this can be seen with cirrhosis and certain infiltrative disorders. Patient demonstrated hepatic steatosis on prior CT. No focal hepatic mass or nodularity grossly identified, though assessment of intrahepatic detail is limited by sound attenuation. Portal vein patent with normal direction of blood flow towards the liver IVC: Normal appearance Pancreas: Body and proximal tail normal appearance, remainder obscured by bowel gas Spleen: Normal appearance, 6.6 cm length Right Kidney: Length: 11.8 cm. Normal morphology without mass or hydronephrosis. Left Kidney: Length: 10.7 cm. Normal morphology without mass or hydronephrosis. Abdominal aorta: Bifurcation obscured by bowel gas. Visualized aorta normal caliber. Other findings: No free fluid IMPRESSION: Probable fatty infiltration of liver. Incomplete pancreatic visualization. Sonographer indicated presence of a sonographic Murphy sign though no other gallbladder sonographic abnormalities are identified, significance uncertain. If there is persistent clinical concern for acute cholecystitis consider hepatobiliary imaging. Electronically Signed   By: Ulyses Southward M.D.   On: 09/17/2018 15:04    Anti-infectives: Anti-infectives (From admission, onward)  None       Assessment/Plan:  Abd pain. No surgical intervention at this time Agree with GI, may need HIDA w EF if endoscopy is unrevealing No surgical intervention  Sterling Big, MD, [facility]  09/18/2018

## 2018-09-18 NOTE — Plan of Care (Signed)
  Problem: Coping: Goal: Level of anxiety will decrease Outcome: Progressing   Problem: Pain Managment: Goal: General experience of comfort will improve Outcome: Progressing   

## 2018-09-18 NOTE — Plan of Care (Signed)
Patient diet advanced to Cardiac diet - But did not tolerate well. Within about 10 min after eating (Toast and eggs), increased abdominal pain and a strong urge to have bowel movement.  When attempted she had no results, though urge and pain continued.

## 2018-09-18 NOTE — Progress Notes (Signed)
[employer] - [place] at [facility]   PATIENT NAME: Cassandra Cortez    MR#:  [id number]  DATE OF BIRTH:  1981/09/20  SUBJECTIVE:  CHIEF COMPLAINT:   Chief Complaint  Patient presents with  . Abdominal Pain   Continues with right upper quadrant abdominal pain.  Worsened after eating.  Afebrile.  REVIEW OF SYSTEMS:    Review of Systems  Constitutional: Positive for malaise/fatigue. Negative for chills and fever.  HENT: Negative for sore throat.   Eyes: Negative for blurred vision, double vision and pain.  Respiratory: Negative for cough, hemoptysis, shortness of breath and wheezing.   Cardiovascular: Negative for chest pain, palpitations, orthopnea and leg swelling.  Gastrointestinal: Positive for abdominal pain, diarrhea, nausea and vomiting. Negative for constipation and heartburn.  Genitourinary: Negative for dysuria and hematuria.  Musculoskeletal: Negative for back pain and joint pain.  Skin: Negative for rash.  Neurological: Negative for sensory change, speech change, focal weakness and headaches.  Endo/Heme/Allergies: Does not bruise/bleed easily.  Psychiatric/Behavioral: Negative for depression. The patient is not nervous/anxious.     DRUG ALLERGIES:   Allergies  Allergen Reactions  . Penicillins Swelling and Rash    Has patient had a PCN reaction causing immediate rash, facial/tongue/throat swelling, SOB or lightheadedness with hypotension: Yes Has patient had a PCN reaction causing severe rash involving mucus membranes or skin necrosis: No Has patient had a PCN reaction that required hospitalization No Has patient had a PCN reaction occurring within the last 10 years: No If all of the above answers are "NO", then may proceed with Cephalosporin use.  . Sulfasalazine Rash and Swelling  . Sulfa Antibiotics Swelling and Rash    VITALS:  Blood pressure (!) 160/90, pulse 65, temperature 98 F (36.7 C), temperature source Axillary, resp. rate (!) 21,  height 5' 1.5" (1.562 m), weight 108.9 kg, last menstrual period 09/14/2018, SpO2 100 %.  PHYSICAL EXAMINATION:   Physical Exam  GENERAL:  37 y.o.-year-old patient lying in the bed with no acute distress.  EYES: Pupils equal, round, reactive to light and accommodation. No scleral icterus. Extraocular muscles intact.  HEENT: Head atraumatic, normocephalic. Oropharynx and nasopharynx clear.  NECK:  Supple, no jugular venous distention. No thyroid enlargement, no tenderness.  LUNGS: Normal breath sounds bilaterally, no wheezing, rales, rhonchi. No use of accessory muscles of respiration.  CARDIOVASCULAR: S1, S2 normal. No murmurs, rubs, or gallops.  ABDOMEN: Soft, right upper quadrant tenderness, nondistended. Bowel sounds present. No organomegaly or mass.  EXTREMITIES: No cyanosis, clubbing or edema b/l.    NEUROLOGIC: Cranial nerves II through XII are intact. No focal Motor or sensory deficits b/l.   PSYCHIATRIC: The patient is alert and oriented x 3.  SKIN: No obvious rash, lesion, or ulcer.   LABORATORY PANEL:   CBC Recent Labs  Lab 09/18/18 0406  WBC 10.5  HGB 13.0  HCT 39.1  PLT 324   ------------------------------------------------------------------------------------------------------------------ Chemistries  Recent Labs  Lab 09/18/18 0406  NA 138  K 3.5  CL 105  CO2 26  GLUCOSE 98  BUN 6  CREATININE 0.71  CALCIUM 8.5*  AST 26  ALT 28  ALKPHOS 28*  BILITOT 0.7   ------------------------------------------------------------------------------------------------------------------  Cardiac Enzymes No results for input(s): TROPONINI in the last 168 hours. ------------------------------------------------------------------------------------------------------------------  RADIOLOGY:  Nm Hepatobiliary Liver Func  Result Date: 09/18/2018 CLINICAL DATA:  37 year old female with right upper quadrant abdominal pain and questionable acalculous cholecystitis on ultrasound  yesterday. EXAM: NUCLEAR MEDICINE HEPATOBILIARY IMAGING TECHNIQUE: Sequential images of the  abdomen were obtained out to 60 minutes following intravenous administration of radiopharmaceutical. RADIOPHARMACEUTICALS:  4.7 mCi Tc-7m  Choletec IV COMPARISON:  Abdomen ultrasound 09/17/2018. CT Abdomen and Pelvis 07/17/2018. FINDINGS: Prompt radiotracer uptake by the liver and clearance of the blood pool. Gallbladder activity visible by 10 minutes and increases over the 1 hour duration of the study. CBD activity visible by 20-25 minutes. Small bowel activity visible by 30-35 minutes. IMPRESSION: Normal, patent cystic duct and CBD.  No evidence of cholecystitis. Electronically Signed   By: Odessa Fleming M.D.   On: 09/18/2018 08:38   US Abdomen Complete  Result Date: 09/17/2018 CLINICAL DATA:  RIGHT upper quadrant pain intermittently for 5 months, elevated lipase EXAM: ABDOMEN ULTRASOUND COMPLETE COMPARISON:  CT abdomen and pelvis 07/17/2018 FINDINGS: Gallbladder: Well distended gallbladder. No gallstones, gallbladder wall thickening or pericholecystic fluid. Sonographer indicates focal tenderness over the gallbladder with transducer pressure/sonographic Murphy sign. Common bile duct: Diameter: 5 mm diameter, normal Liver: Echogenic parenchyma, likely fatty infiltration though this can be seen with cirrhosis and certain infiltrative disorders. Patient demonstrated hepatic steatosis on prior CT. No focal hepatic mass or nodularity grossly identified, though assessment of intrahepatic detail is limited by sound attenuation. Portal vein patent with normal direction of blood flow towards the liver IVC: Normal appearance Pancreas: Body and proximal tail normal appearance, remainder obscured by bowel gas Spleen: Normal appearance, 6.6 cm length Right Kidney: Length: 11.8 cm. Normal morphology without mass or hydronephrosis. Left Kidney: Length: 10.7 cm. Normal morphology without mass or hydronephrosis. Abdominal aorta: Bifurcation  obscured by bowel gas. Visualized aorta normal caliber. Other findings: No free fluid IMPRESSION: Probable fatty infiltration of liver. Incomplete pancreatic visualization. Sonographer indicated presence of a sonographic Murphy sign though no other gallbladder sonographic abnormalities are identified, significance uncertain. If there is persistent clinical concern for acute cholecystitis consider hepatobiliary imaging. Electronically Signed   By: Ulyses Southward M.D.   On: 09/17/2018 15:04   ASSESSMENT AND PLAN:   *Right upper quadrant pain CT and [place] normal. HIDA normal. Discussed with GI.  EGD tomorrow.  *Mild elevation in lipase. No signs of pancreatitis.  *Asthma. Albuterol as needed.  All the records are reviewed and case discussed with Care Management/Social Workerr. Management plans discussed with the patient, family and they are in agreement.  CODE STATUS:   DVT Prophylaxis: SCDs  TOTAL TIME TAKING CARE OF THIS PATIENT: 30 minutes.   POSSIBLE D/C IN 1-2 DAYS, DEPENDING ON CLINICAL CONDITION.  Molinda Bailiff Suman Trivedi M.D on 09/18/2018 at 1:06 PM  Between 7am to 6pm - Pager - [phone]  After 6pm go to [email or web address] - password [email or web address] [facility]  [facility]  Office  [phone]  CC: Primary care physician; Center, [facility]  Note: This dictation was prepared with Nurse, children's dictation along with smaller phrase technology. Any transcriptional errors that result from this process are unintentional.

## 2018-09-18 NOTE — Consult Note (Signed)
Cassandra Cortez , MD [address], [place], [place], [address] [address], [address], [place], [place], [address] Phone: [phone]  Fax: [phone]  Consultation  Referring Provider:  Dr  Elpidio Anis  Primary Care Physician:  Center, [facility] Primary Gastroenterologist:  Dr. Maximino Greenland          Reason for Consultation:     Abdominal pain   Date of Admission:  09/17/2018 Date of Consultation:  09/18/2018         HPI:   Cassandra Cortez is a 37 y.o. female is a patient of Dr. Maximino Greenland as an outpatient and has last seen her on 07/20/2018 for abdominal pain and diarrhea.  Per her last note she says that the abdominal pain began in May 2019 lasted over 2 weeks resolved and then recurred.  Pain is in the right side of the abdomen.  She went to the ER in May 2019 with negative imaging and lab results.  She was treated with antacids antiemetics and sent home.  This helped with the pain.  Similar presentation in August 2019 and again had a normal CT scan ultrasound.  He has used NSAIDs particularly naproxen and Aleve in the past.  Few times a month.  Has had some bright red blood per rectum in the past.  Father died of colon cancer age of 60.  At her initial visit the plan was to work-up her diarrhea to rule out infectious causes, check H. pylori serology.  She was advised to stop marijuana use which could cause her nausea and vomiting.  There is no plan for EGD or colonoscopy at that point of time.  She returned to the hospital on 09/17/2018.  She came in with abdominal pain.  HIDA scan showed patent cystic duct and common bile duct.  No mention of ejection fraction on the HIDA scan.  On admission her lipase was elevated at 138.  Normal liver function tests. Normal CBC, moderate hemoglobin blood.  She says the pain has been ongoing for 4 to 5 months on and off but this time it is been lasting longer.  Right upper quadrant, sharp in nature sometimes describes it as an  explosion.  Usually worse 30 minutes after eating.  Not relieved by a bowel movement.  She has had diarrhea in the past but has not had a bowel movement after coming to the hospital.  She denies any preceding use of NSAIDs.  Occasional use of marijuana.  The pain is nonradiating. Past Medical History:  Diagnosis Date  . Asthma     Past Surgical History:  Procedure Laterality Date  . APPENDECTOMY    . CESAREAN SECTION    . LEEP      Prior to Admission medications   Medication Sig Start Date End Date Taking? Authorizing Provider  albuterol (PROVENTIL HFA;VENTOLIN HFA) 108 (90 Base) MCG/ACT inhaler Inhale 1-2 puffs every 6 (six) hours as needed into the lungs for wheezing or shortness of breath.   Yes [provider]  famotidine (PEPCID) 20 MG tablet Take 1 tablet (20 mg total) by mouth 2 (two) times daily. Patient not taking: Reported on 09/17/2018 07/17/18   Darci Current, MD  ipratropium-albuterol (DUONEB) 0.5-2.5 (3) MG/3ML SOLN Take 3 mLs 4 (four) times daily as needed by nebulization (asthma.). Patient not taking: Reported on 09/17/2018 10/22/17   Enid Baas, MD  ondansetron (ZOFRAN ODT) 4 MG disintegrating tablet Take 1 tablet (4 mg total) by mouth every 8 (  eight) hours as needed for nausea or vomiting. Patient not taking: Reported on 09/17/2018 07/17/18   Darci Current, MD  oxyCODONE-acetaminophen (PERCOCET) 5-325 MG tablet Take 1 tablet by mouth every 8 (eight) hours as needed. Patient not taking: Reported on 09/17/2018 07/17/18   Darci Current, MD    Family History  Problem Relation Age of Onset  . Cancer Father 83       Colon cancer  . Asthma Other      Social History   Tobacco Use  . Smoking status: Never Smoker  . Smokeless tobacco: Never Used  Substance Use Topics  . Alcohol use: Not Currently  . Drug use: No    Allergies as of 09/17/2018 - Review Complete 09/17/2018  Allergen Reaction Noted  . Penicillins Swelling and Rash 06/07/2015  .  Sulfasalazine Rash and Swelling 06/07/2015  . Sulfa antibiotics Swelling and Rash 06/07/2015    Review of Systems:    All systems reviewed and negative except where noted in HPI.   Physical Exam:  Vital signs in last 24 hours: Temp:  [97.9 F (36.6 C)-98 F (36.7 C)] 98 F (36.7 C) (10/04 2314) Pulse Rate:  [57-81] 65 (10/04 2314) Resp:  [7-21] 21 (10/04 2314) BP: (121-160)/(58-128) 160/90 (10/04 2349) SpO2:  [95 %-100 %] 100 % (10/04 2314)   General:   Pleasant, cooperative in NAD Head:  Normocephalic and atraumatic. Eyes:   No icterus.   Conjunctiva pink. PERRLA. Ears:  Normal auditory acuity. Neck:  Supple; no masses or thyroidomegaly Lungs: Respirations even and unlabored. Lungs clear to auscultation bilaterally.   No wheezes, crackles, or rhonchi.  Heart:  Regular rate and rhythm;  Without murmur, clicks, rubs or gallops Abdomen:  Soft, nondistended, point tenderness in the right upper quadrant midclavicular line at the particular point just below the last rib.  No guarding no rigidity no organomegaly bowel sounds are present. Neurologic:  Alert and oriented x3;  grossly normal neurologically. Skin:  Intact without significant lesions or rashes. Cervical Nodes:  No significant cervical adenopathy. Psych:  Alert and cooperative. Normal affect.  LAB RESULTS: Recent Labs    09/17/18 1119 09/18/18 0406  WBC 10.7 10.5  HGB 14.6 13.0  HCT 44.5 39.1  PLT 411 324   BMET Recent Labs    09/17/18 1119 09/18/18 0406  NA 138 138  K 3.6 3.5  CL 106 105  CO2 24 26  GLUCOSE 132* 98  BUN 10 6  CREATININE 0.85 0.71  CALCIUM 9.0 8.5*   LFT Recent Labs    09/18/18 0406  PROT 6.7  ALBUMIN 3.7  AST 26  ALT 28  ALKPHOS 28*  BILITOT 0.7   PT/INR No results for input(s): LABPROT, INR in the last 72 hours.  STUDIES: Nm Hepatobiliary Liver Func  Result Date: 09/18/2018 CLINICAL DATA:  37 year old female with right upper quadrant abdominal pain and questionable  acalculous cholecystitis on ultrasound yesterday. EXAM: NUCLEAR MEDICINE HEPATOBILIARY IMAGING TECHNIQUE: Sequential images of the abdomen were obtained out to 60 minutes following intravenous administration of radiopharmaceutical. RADIOPHARMACEUTICALS:  4.7 mCi Tc-44m  Choletec IV COMPARISON:  Abdomen ultrasound 09/17/2018. CT Abdomen and Pelvis 07/17/2018. FINDINGS: Prompt radiotracer uptake by the liver and clearance of the blood pool. Gallbladder activity visible by 10 minutes and increases over the 1 hour duration of the study. CBD activity visible by 20-25 minutes. Small bowel activity visible by 30-35 minutes. IMPRESSION: Normal, patent cystic duct and CBD.  No evidence of cholecystitis. Electronically Signed  By: Odessa Fleming M.D.   On: 09/18/2018 08:38   US Abdomen Complete  Result Date: 09/17/2018 CLINICAL DATA:  RIGHT upper quadrant pain intermittently for 5 months, elevated lipase EXAM: ABDOMEN ULTRASOUND COMPLETE COMPARISON:  CT abdomen and pelvis 07/17/2018 FINDINGS: Gallbladder: Well distended gallbladder. No gallstones, gallbladder wall thickening or pericholecystic fluid. Sonographer indicates focal tenderness over the gallbladder with transducer pressure/sonographic Murphy sign. Common bile duct: Diameter: 5 mm diameter, normal Liver: Echogenic parenchyma, likely fatty infiltration though this can be seen with cirrhosis and certain infiltrative disorders. Patient demonstrated hepatic steatosis on prior CT. No focal hepatic mass or nodularity grossly identified, though assessment of intrahepatic detail is limited by sound attenuation. Portal vein patent with normal direction of blood flow towards the liver IVC: Normal appearance Pancreas: Body and proximal tail normal appearance, remainder obscured by bowel gas Spleen: Normal appearance, 6.6 cm length Right Kidney: Length: 11.8 cm. Normal morphology without mass or hydronephrosis. Left Kidney: Length: 10.7 cm. Normal morphology without mass or  hydronephrosis. Abdominal aorta: Bifurcation obscured by bowel gas. Visualized aorta normal caliber. Other findings: No free fluid IMPRESSION: Probable fatty infiltration of liver. Incomplete pancreatic visualization. Sonographer indicated presence of a sonographic Murphy sign though no other gallbladder sonographic abnormalities are identified, significance uncertain. If there is persistent clinical concern for acute cholecystitis consider hepatobiliary imaging. Electronically Signed   By: Ulyses Southward M.D.   On: 09/17/2018 15:04      Impression / Plan:   TASNIA SPEGAL is a 37 y.o. y/o female with 4 to 108-month history of right upper quadrant pain.  Normal liver function tests, normal ultrasound of the abdomen and CT scan of the abdomen shows no gross abnormality.  Only abnormality seen so far is blood in the urine analysis.  Since she has has a history of rectal bleeding, abdominal pain for a few months I think the next step would be endoscopy evaluation.  Plan 1.  PPI. 2.  Check H. pylori stool antigen. 3.  No NSAIDs. 4.  EGD to evaluate dyspepsia and colonoscopy at the same time to evaluate abdominal pain as well as issues of rectal bleeding that she has had in the past. 5.  If above evaluation is negative she probably requires a HIDA scan with ejection fraction to be checked.  6.  May need further evaluation for blood seen in urine she denies being on a..  Consider urology referral. 7.  Very likely that all of this is musculoskeletal as she has point tenderness in the right upper quadrant in the mid clavicular line and is markedly tender that one particular point.  I have discussed alternative options, risks & benefits,  which include, but are not limited to, bleeding, infection, perforation,respiratory complication & drug reaction.  The patient agrees with this plan & written consent will be obtained.     Thank you for involving me in the care of this patient.      LOS: 1 day   Cassandra Mood, MD  09/18/2018, 12:31 PM

## 2018-09-18 NOTE — Progress Notes (Signed)
Pt tolerating Bowel prep well. Pt is stooling frequently.

## 2018-09-19 ENCOUNTER — Encounter
Admission: EM | Disposition: A | Discharge status: Home / Self Care | Payer: Self-pay | Source: Home / Self Care | Attending: Internal Medicine

## 2018-09-19 ENCOUNTER — Inpatient Hospital Stay: Payer: Medicaid Other | Admitting: Anesthesiology

## 2018-09-19 ENCOUNTER — Encounter: Payer: Self-pay | Admitting: Anesthesiology

## 2018-09-19 DIAGNOSIS — K625 Hemorrhage of anus and rectum: Secondary | ICD-10-CM

## 2018-09-19 HISTORY — PX: ESOPHAGOGASTRODUODENOSCOPY (EGD) WITH PROPOFOL: SHX5813

## 2018-09-19 HISTORY — PX: COLONOSCOPY WITH PROPOFOL: SHX5780

## 2018-09-19 SURGERY — ESOPHAGOGASTRODUODENOSCOPY (EGD) WITH PROPOFOL
Anesthesia: General

## 2018-09-19 MED ORDER — ONDANSETRON HCL 4 MG/2ML IJ SOLN
INTRAMUSCULAR | Status: DC | PRN
  Administered 2018-09-19: 4 mg via INTRAVENOUS

## 2018-09-19 MED ORDER — PROPOFOL 500 MG/50ML IV EMUL
INTRAVENOUS | Status: AC
  Filled 2018-09-19: qty 50

## 2018-09-19 MED ORDER — GLYCOPYRROLATE 0.2 MG/ML IJ SOLN
INTRAMUSCULAR | Status: DC | PRN
  Administered 2018-09-19: 0.2 mg via INTRAVENOUS

## 2018-09-19 MED ORDER — MORPHINE SULFATE (PF) 2 MG/ML IV SOLN
2.0000 mg | Freq: Once | INTRAVENOUS | Status: AC
  Administered 2018-09-19: 2 mg via INTRAVENOUS
  Filled 2018-09-19: qty 1

## 2018-09-19 MED ORDER — PROPOFOL 500 MG/50ML IV EMUL
INTRAVENOUS | Status: DC | PRN
  Administered 2018-09-19: 150 ug/kg/min via INTRAVENOUS

## 2018-09-19 MED ORDER — LIDOCAINE 2% (20 MG/ML) 5 ML SYRINGE
INTRAMUSCULAR | Status: DC | PRN
  Administered 2018-09-19: 100 mg via INTRAVENOUS

## 2018-09-19 MED ORDER — ONDANSETRON HCL 4 MG/2ML IJ SOLN
INTRAMUSCULAR | Status: AC
  Filled 2018-09-19: qty 2

## 2018-09-19 MED ORDER — PROPOFOL 10 MG/ML IV BOLUS
INTRAVENOUS | Status: DC | PRN
  Administered 2018-09-19: 50 mg via INTRAVENOUS
  Administered 2018-09-19: 100 mg via INTRAVENOUS

## 2018-09-19 MED ORDER — MIDAZOLAM HCL 2 MG/2ML IJ SOLN
INTRAMUSCULAR | Status: AC
  Filled 2018-09-19: qty 2

## 2018-09-19 NOTE — Op Note (Signed)
[facility] Gastroenterology Patient Name: Cassandra Cortez Procedure Date: 09/19/2018 1:13 PM MRN: [id number] Account #: [id number] Date of Birth: 1981/02/14 Admit Type: Inpatient Age: 37 Room: [facility] ENDO ROOM 4 Gender: Female Note Status: Finalized Procedure:            Colonoscopy Indications:          Rectal bleeding Providers:            [name] MD, MD Referring MD:         No Local Md, MD (Referring MD) Medicines:            Monitored Anesthesia Care Complications:        No immediate complications. Procedure:            Pre-Anesthesia Assessment:                       - Prior to the procedure, a History and Physical was                        performed, and patient medications, allergies and                        sensitivities were reviewed. The patient's tolerance of                        previous anesthesia was reviewed.                       - The risks and benefits of the procedure and the                        sedation options and risks were discussed with the                        patient. All questions were answered and informed                        consent was obtained.                       - ASA Grade Assessment: II - A patient with mild                        systemic disease.                       After obtaining informed consent, the colonoscope was                        passed under direct vision. Throughout the procedure,                        the patient's blood pressure, pulse, and oxygen                        saturations were monitored continuously. The                        Colonoscope was introduced through the anus and                        advanced  to the the cecum, identified by the                        appendiceal orifice, IC valve and transillumination.                        The colonoscopy was performed with ease. The patient                        tolerated the procedure well. The quality of the bowel           preparation was fair. Findings:      The perianal and digital rectal examinations were normal.      A large amount of semi-liquid stool was found in the entire colon,       interfering with visualization. Impression:           - Preparation of the colon was fair.                       - Stool in the entire examined colon.                       - No specimens collected. Recommendation:       - Return patient to hospital ward for ongoing care.                       - Advance diet as tolerated.                       - Continue present medications.                       - Possible constipation is cause of pain, commence on                        daily miralax, if pain still persists despite good                        bowel movements then consider evaluating Gall bladder EF Procedure Code(s):    --- Professional ---                       [phone], Colonoscopy, flexible; diagnostic, including                        collection of specimen(s) by brushing or washing, when                        performed (separate procedure) Diagnosis Code(s):    --- Professional ---                       K62.5, Hemorrhage of anus and rectum CPT copyright 2017 American Medical Association. All rights reserved. The codes documented in this report are preliminary and upon coder review may  be revised to meet current compliance requirements. [name], MD [name] MD, MD 09/19/2018 1:40:43 PM This report has been signed electronically. Number of Addenda: 0 Note Initiated On: 09/19/2018 1:13 PM Scope Withdrawal Time: 0 hours 3 minutes 46 seconds  Total Procedure Duration: 0 hours 6 minutes 3 seconds       [facility]

## 2018-09-19 NOTE — Progress Notes (Signed)
Still with complaints of stomach pains during the night. Was able to successfully drink all of bowel prep. Pt stating that stool is coming out clear at this time. Iv infusing without difficulty.

## 2018-09-19 NOTE — Op Note (Signed)
[facility] Gastroenterology Patient Name: Cassandra Cortez Procedure Date: 09/19/2018 1:13 PM MRN: [id number] Account #: [id number] Date of Birth: [birth year] Admit Type: Inpatient Age: 37 Room: [facility] ENDO ROOM 4 Gender: Female Note Status: Finalized Procedure:            Upper GI endoscopy Indications:          Abdominal pain in the right upper quadrant Providers:            [name] MD, MD Referring MD:         No Local Md, MD (Referring MD) Medicines:            Monitored Anesthesia Care Complications:        No immediate complications. Procedure:            Pre-Anesthesia Assessment:                       - Prior to the procedure, a History and Physical was                        performed, and patient medications, allergies and                        sensitivities were reviewed. The patient's tolerance of                        previous anesthesia was reviewed.                       - The risks and benefits of the procedure and the                        sedation options and risks were discussed with the                        patient. All questions were answered and informed                        consent was obtained.                       - ASA Grade Assessment: II - A patient with mild                        systemic disease.                       After obtaining informed consent, the endoscope was                        passed under direct vision. Throughout the procedure,                        the patient's blood pressure, pulse, and oxygen                        saturations were monitored continuously. The Endoscope                        was introduced through the mouth, and advanced to the  third part of duodenum. The upper GI endoscopy was                        accomplished with ease. The patient tolerated the                        procedure well. Findings:      The stomach was normal.      The examined duodenum was  normal.      The esophagus was normal.      The entire examined stomach was normal. Biopsies were taken with a cold       forceps for histology. Impression:           - Normal stomach.                       - Normal examined duodenum.                       - Normal esophagus.                       - Normal stomach. Biopsied. Recommendation:       - Await pathology results.                       - Perform a colonoscopy today. Procedure Code(s):    --- Professional ---                       [phone], Esophagogastroduodenoscopy, flexible, transoral;                        with biopsy, single or multiple Diagnosis Code(s):    --- Professional ---                       R10.11, Right upper quadrant pain CPT copyright 2017 American Medical Association. All rights reserved. The codes documented in this report are preliminary and upon coder review may  be revised to meet current compliance requirements. [name], MD [name] MD, MD 09/19/2018 1:27:52 PM This report has been signed electronically. Number of Addenda: 0 Note Initiated On: 09/19/2018 1:13 PM      [facility]

## 2018-09-19 NOTE — Anesthesia Postprocedure Evaluation (Signed)
Anesthesia Post Note  Patient: Cassandra Cortez  Procedure(s) Performed: ESOPHAGOGASTRODUODENOSCOPY (EGD) WITH PROPOFOL (N/A ) COLONOSCOPY WITH PROPOFOL (N/A )  Patient location during evaluation: Endoscopy Anesthesia Type: General Level of consciousness: awake and alert Pain management: pain level controlled Vital Signs Assessment: post-procedure vital signs reviewed and stable Respiratory status: spontaneous breathing, nonlabored ventilation, respiratory function stable and patient connected to nasal cannula oxygen Cardiovascular status: blood pressure returned to baseline and stable Postop Assessment: no apparent nausea or vomiting Anesthetic complications: no     Last Vitals:  Vitals:   09/19/18 1443 09/19/18 1641  BP: (!) 140/97 (!) 150/79  Pulse: 86 82  Resp:    Temp: 36.8 C 36.8 C  SpO2: 98% 98%    Last Pain:  Vitals:   09/19/18 1924  TempSrc:   PainSc: 0-No pain                 Lenard Simmer

## 2018-09-19 NOTE — Progress Notes (Signed)
[facility] - [place] [facility]   PATIENT NAME: Cassandra Cortez    MR#:  [id number]  DATE OF BIRTH:  1981/11/03  SUBJECTIVE:  CHIEF COMPLAINT:   Chief Complaint  Patient presents with  . Abdominal Pain   Continues with right upper quadrant abdominal pain.  NPO for EGD  REVIEW OF SYSTEMS:    Review of Systems  Constitutional: Positive for malaise/fatigue. Negative for chills and fever.  HENT: Negative for sore throat.   Eyes: Negative for blurred vision, double vision and pain.  Respiratory: Negative for cough, hemoptysis, shortness of breath and wheezing.   Cardiovascular: Negative for chest pain, palpitations, orthopnea and leg swelling.  Gastrointestinal: Positive for abdominal pain, diarrhea, nausea and vomiting. Negative for constipation and heartburn.  Genitourinary: Negative for dysuria and hematuria.  Musculoskeletal: Negative for back pain and joint pain.  Skin: Negative for rash.  Neurological: Negative for sensory change, speech change, focal weakness and headaches.  Endo/Heme/Allergies: Does not bruise/bleed easily.  Psychiatric/Behavioral: Negative for depression. The patient is not nervous/anxious.     DRUG ALLERGIES:   Allergies  Allergen Reactions  . Penicillins Swelling and Rash    Has patient had a PCN reaction causing immediate rash, facial/tongue/throat swelling, SOB or lightheadedness with hypotension: Yes Has patient had a PCN reaction causing severe rash involving mucus membranes or skin necrosis: No Has patient had a PCN reaction that required hospitalization No Has patient had a PCN reaction occurring within the last 10 years: No If all of the above answers are "NO", then may proceed with Cephalosporin use.  . Sulfasalazine Rash and Swelling  . Sulfa Antibiotics Swelling and Rash    VITALS:  Blood pressure (!) 143/86, pulse 72, temperature 98.2 F (36.8 C), temperature source Oral, resp. rate 17, height 5' 1.5" (1.562 m),  weight 108.9 kg, last menstrual period 09/14/2018, SpO2 97 %.  PHYSICAL EXAMINATION:   Physical Exam  GENERAL:  37 y.o.-year-old patient lying in the bed with no acute distress.  EYES: Pupils equal, round, reactive to light and accommodation. No scleral icterus. Extraocular muscles intact.  HEENT: Head atraumatic, normocephalic. Oropharynx and nasopharynx clear.  NECK:  Supple, no jugular venous distention. No thyroid enlargement, no tenderness.  LUNGS: Normal breath sounds bilaterally, no wheezing, rales, rhonchi. No use of accessory muscles of respiration.  CARDIOVASCULAR: S1, S2 normal. No murmurs, rubs, or gallops.  ABDOMEN: Soft, right upper quadrant tenderness, nondistended. Bowel sounds present. No organomegaly or mass.  EXTREMITIES: No cyanosis, clubbing or edema b/l.    NEUROLOGIC: Cranial nerves II through XII are intact. No focal Motor or sensory deficits b/l.   PSYCHIATRIC: The patient is alert and oriented x 3.  SKIN: No obvious rash, lesion, or ulcer.   LABORATORY PANEL:   CBC Recent Labs  Lab 09/18/18 0406  WBC 10.5  HGB 13.0  HCT 39.1  PLT 324   ------------------------------------------------------------------------------------------------------------------ Chemistries  Recent Labs  Lab 09/18/18 0406  NA 138  K 3.5  CL 105  CO2 26  GLUCOSE 98  BUN 6  CREATININE 0.71  CALCIUM 8.5*  AST 26  ALT 28  ALKPHOS 28*  BILITOT 0.7   ------------------------------------------------------------------------------------------------------------------  Cardiac Enzymes No results for input(s): TROPONINI in the last 168 hours. ------------------------------------------------------------------------------------------------------------------  RADIOLOGY:  Nm Hepatobiliary Liver Func  Result Date: 09/18/2018 CLINICAL DATA:  37 year old female with right upper quadrant abdominal pain and questionable acalculous cholecystitis on ultrasound yesterday. EXAM: NUCLEAR  MEDICINE HEPATOBILIARY IMAGING TECHNIQUE: Sequential images of the abdomen were obtained  out to 60 minutes following intravenous administration of radiopharmaceutical. RADIOPHARMACEUTICALS:  4.7 mCi Tc-11m  Choletec IV COMPARISON:  Abdomen ultrasound 09/17/2018. CT Abdomen and Pelvis 07/17/2018. FINDINGS: Prompt radiotracer uptake by the liver and clearance of the blood pool. Gallbladder activity visible by 10 minutes and increases over the 1 hour duration of the study. CBD activity visible by 20-25 minutes. Small bowel activity visible by 30-35 minutes. IMPRESSION: Normal, patent cystic duct and CBD.  No evidence of cholecystitis. Electronically Signed   By: Odessa Fleming M.D.   On: 09/18/2018 08:38   US Abdomen Complete  Result Date: 09/17/2018 CLINICAL DATA:  RIGHT upper quadrant pain intermittently for 5 months, elevated lipase EXAM: ABDOMEN ULTRASOUND COMPLETE COMPARISON:  CT abdomen and pelvis 07/17/2018 FINDINGS: Gallbladder: Well distended gallbladder. No gallstones, gallbladder wall thickening or pericholecystic fluid. Sonographer indicates focal tenderness over the gallbladder with transducer pressure/sonographic Murphy sign. Common bile duct: Diameter: 5 mm diameter, normal Liver: Echogenic parenchyma, likely fatty infiltration though this can be seen with cirrhosis and certain infiltrative disorders. Patient demonstrated hepatic steatosis on prior CT. No focal hepatic mass or nodularity grossly identified, though assessment of intrahepatic detail is limited by sound attenuation. Portal vein patent with normal direction of blood flow towards the liver IVC: Normal appearance Pancreas: Body and proximal tail normal appearance, remainder obscured by bowel gas Spleen: Normal appearance, 6.6 cm length Right Kidney: Length: 11.8 cm. Normal morphology without mass or hydronephrosis. Left Kidney: Length: 10.7 cm. Normal morphology without mass or hydronephrosis. Abdominal aorta: Bifurcation obscured by bowel gas.  Visualized aorta normal caliber. Other findings: No free fluid IMPRESSION: Probable fatty infiltration of liver. Incomplete pancreatic visualization. Sonographer indicated presence of a sonographic Murphy sign though no other gallbladder sonographic abnormalities are identified, significance uncertain. If there is persistent clinical concern for acute cholecystitis consider hepatobiliary imaging. Electronically Signed   By: Ulyses Southward M.D.   On: 09/17/2018 15:04   ASSESSMENT AND PLAN:   *Right upper quadrant pain CT and [place] normal. HIDA normal. EGD today  *Mild elevation in lipase. No signs of pancreatitis.  *Asthma. Albuterol as needed.  All the records are reviewed and case discussed with Care Management/Social Workerr. Management plans discussed with the patient, family and they are in agreement.  CODE STATUS:   DVT Prophylaxis: SCDs  TOTAL TIME TAKING CARE OF THIS PATIENT: 30 minutes.   POSSIBLE D/C IN 1-2 DAYS, DEPENDING ON CLINICAL CONDITION.  Molinda Bailiff Iriana Artley M.D on 09/19/2018 at 11:17 AM  Between 7am to 6pm - Pager - [phone]  After 6pm go to [email or web address] - password [email or web address] [facility]  [facility] [place] [facility]  Office  [phone]  CC: Primary care physician; [facility]  Note: This dictation was prepared with Nurse, children's dictation along with smaller phrase technology. Any transcriptional errors that result from this process are unintentional.

## 2018-09-19 NOTE — Anesthesia Preprocedure Evaluation (Addendum)
Anesthesia Evaluation  Patient identified by MRN, date of birth, ID band Patient awake    Reviewed: Allergy & Precautions, H&P , NPO status , Patient's Chart, lab work & pertinent test results, reviewed documented beta blocker date and time   History of Anesthesia Complications Negative for: history of anesthetic complications  Airway Mallampati: II  TM Distance: >3 FB Neck ROM: full    Dental  (+) Dental Advidsory Given, Poor Dentition, Chipped, Teeth Intact   Pulmonary neg shortness of breath, asthma , neg sleep apnea, neg COPD, neg recent URI,           Cardiovascular Exercise Tolerance: Good negative cardio ROS       Neuro/Psych negative neurological ROS  negative psych ROS   GI/Hepatic Neg liver ROS, GERD  ,  Endo/Other  neg diabetesMorbid obesity  Renal/GU negative Renal ROS  negative genitourinary   Musculoskeletal   Abdominal   Peds  Hematology negative hematology ROS (+)   Anesthesia Other Findings Past Medical History: No date: Asthma   Reproductive/Obstetrics negative OB ROS                            Anesthesia Physical Anesthesia Plan  ASA: III  Anesthesia Plan: General   Post-op Pain Management:    Induction: Intravenous  PONV Risk Score and Plan: 3 and Propofol infusion and TIVA  Airway Management Planned: Nasal Cannula and Natural Airway  Additional Equipment:   Intra-op Plan:   Post-operative Plan:   Informed Consent: I have reviewed the patients History and Physical, chart, labs and discussed the procedure including the risks, benefits and alternatives for the proposed anesthesia with the patient or authorized representative who has indicated his/her understanding and acceptance.   Dental Advisory Given  Plan Discussed with: Anesthesiologist, CRNA and Surgeon  Anesthesia Plan Comments:        Anesthesia Quick Evaluation

## 2018-09-19 NOTE — H&P (Signed)
Cassandra Mood, MD [address], [place], [place], [address] [address], [address], [place], [place], [address] Phone: [phone]  Fax: [phone]  Primary Care Physician:  [facility]   Pre-Procedure History & Physical: HPI:  Cassandra Cortez is a 37 y.o. female is here for an endoscopy and colonoscopy    Past Medical History:  Diagnosis Date  . Asthma     Past Surgical History:  Procedure Laterality Date  . APPENDECTOMY    . CESAREAN SECTION    . LEEP      Prior to Admission medications   Medication Sig Start Date End Date Taking? Authorizing Provider  albuterol (PROVENTIL HFA;VENTOLIN HFA) 108 (90 Base) MCG/ACT inhaler Inhale 1-2 puffs every 6 (six) hours as needed into the lungs for wheezing or shortness of breath.   Yes [provider]  famotidine (PEPCID) 20 MG tablet Take 1 tablet (20 mg total) by mouth 2 (two) times daily. Patient not taking: Reported on 09/17/2018 07/17/18   Darci Current, MD  ipratropium-albuterol (DUONEB) 0.5-2.5 (3) MG/3ML SOLN Take 3 mLs 4 (four) times daily as needed by nebulization (asthma.). Patient not taking: Reported on 09/17/2018 10/22/17   Enid Baas, MD  ondansetron (ZOFRAN ODT) 4 MG disintegrating tablet Take 1 tablet (4 mg total) by mouth every 8 (eight) hours as needed for nausea or vomiting. Patient not taking: Reported on 09/17/2018 07/17/18   Darci Current, MD  oxyCODONE-acetaminophen (PERCOCET) 5-325 MG tablet Take 1 tablet by mouth every 8 (eight) hours as needed. Patient not taking: Reported on 09/17/2018 07/17/18   Darci Current, MD    Allergies as of 09/17/2018 - Review Complete 09/17/2018  Allergen Reaction Noted  . Penicillins Swelling and Rash 06/07/2015  . Sulfasalazine Rash and Swelling 06/07/2015  . Sulfa antibiotics Swelling and Rash 06/07/2015    Family History  Problem Relation Age of Onset  . Cancer Father 60       Colon cancer  . Asthma Other      Social History   Socioeconomic History  . Marital status: Single    Spouse name: Not on file  . Number of children: Not on file  . Years of education: Not on file  . Highest education level: Not on file  Occupational History  . Not on file  Social Needs  . Financial resource strain: Not on file  . Food insecurity:    Worry: Not on file    Inability: Not on file  . Transportation needs:    Medical: Not on file    Non-medical: Not on file  Tobacco Use  . Smoking status: Never Smoker  . Smokeless tobacco: Never Used  Substance and Sexual Activity  . Alcohol use: Not Currently  . Drug use: No  . Sexual activity: Not on file  Lifestyle  . Physical activity:    Days per week: Not on file    Minutes per session: Not on file  . Stress: Not on file  Relationships  . Social connections:    Talks on phone: Not on file    Gets together: Not on file    Attends religious service: Not on file    Active member of club or organization: Not on file    Attends meetings of clubs or organizations: Not on file    Relationship status: Not on file  . Intimate partner violence:    Fear of current or ex partner:  Not on file    Emotionally abused: Not on file    Physically abused: Not on file    Forced sexual activity: Not on file  Other Topics Concern  . Not on file  Social History Narrative  . Not on file    Review of Systems: See HPI, otherwise negative ROS  Physical Exam: BP (!) 143/86 (BP Location: Left Arm)   Pulse 72   Temp 98.2 F (36.8 C) (Oral)   Resp 17   Ht 5' 1.5" (1.562 m)   Wt 108.9 kg   LMP 09/14/2018 (Exact Date)   SpO2 97%   BMI 44.61 kg/m  General:   Alert,  pleasant and cooperative in NAD Head:  Normocephalic and atraumatic. Neck:  Supple; no masses or thyromegaly. Lungs:  Clear throughout to auscultation, normal respiratory effort.    Heart:  +S1, +S2, Regular rate and rhythm, No edema. Abdomen:  Soft, nontender and nondistended. Normal bowel  sounds, without guarding, and without rebound.   Neurologic:  Alert and  oriented x4;  grossly normal neurologically.  Impression/Plan: [name] is here for an endoscopy and colonoscopy  to be performed for  evaluation of Abdominal pain and rectal bleeding    Risks, benefits, limitations, and alternatives regarding endoscopy have been reviewed with the patient.  Questions have been answered.  All parties agreeable.   Cassandra Mood, MD  09/19/2018, 1:13 PM

## 2018-09-19 NOTE — Anesthesia Post-op Follow-up Note (Signed)
Anesthesia QCDR form completed.        

## 2018-09-19 NOTE — Plan of Care (Signed)
  Problem: Nutrition: Goal: Adequate nutrition will be maintained Outcome: Progressing   Problem: Coping: Goal: Level of anxiety will decrease Outcome: Progressing   Problem: Pain Managment: Goal: General experience of comfort will improve Outcome: Progressing   

## 2018-09-19 NOTE — Transfer of Care (Signed)
Immediate Anesthesia Transfer of Care Note  Patient: Cassandra Cortez  Procedure(s) Performed: ESOPHAGOGASTRODUODENOSCOPY (EGD) WITH PROPOFOL (N/A ) COLONOSCOPY WITH PROPOFOL (N/A )  Patient Location: Endoscopy Unit  Anesthesia Type:General  Level of Consciousness: awake, alert  and oriented  Airway & Oxygen Therapy: Patient connected to nasal cannula oxygen  Post-op Assessment: Post -op Vital signs reviewed and stable  Post vital signs: stable  Last Vitals:  Vitals Value Taken Time  BP    Temp    Pulse    Resp    SpO2      Last Pain:  Vitals:   09/19/18 1051  TempSrc:   PainSc: 8       Patients Stated Pain Goal: 0 (09/17/18 2229)  Complications: No apparent anesthesia complications

## 2018-09-20 ENCOUNTER — Encounter: Payer: Self-pay | Admitting: Gastroenterology

## 2018-09-20 LAB — HIV ANTIBODY (ROUTINE TESTING W REFLEX): HIV SCREEN 4TH GENERATION: NONREACTIVE

## 2018-09-20 MED ORDER — ONDANSETRON HCL 4 MG PO TABS: 4.0000 mg | ORAL_TABLET | Freq: Three times a day (TID) | ORAL | 0 refills | Status: DC | PRN

## 2018-09-20 MED ORDER — OXYCODONE-ACETAMINOPHEN 5-325 MG PO TABS: 1.0000 | ORAL_TABLET | Freq: Four times a day (QID) | ORAL | 0 refills | Status: DC | PRN

## 2018-09-20 MED ORDER — ENOXAPARIN SODIUM 40 MG/0.4ML ~~LOC~~ SOLN: 40.0000 mg | Freq: Two times a day (BID) | SUBCUTANEOUS | Status: DC

## 2018-09-20 MED ORDER — PANTOPRAZOLE SODIUM 40 MG PO TBEC: 40.0000 mg | DELAYED_RELEASE_TABLET | Freq: Every day | ORAL | 0 refills | Status: DC

## 2018-09-20 MED ORDER — OXYCODONE-ACETAMINOPHEN 5-325 MG PO TABS
1.0000 | ORAL_TABLET | Freq: Four times a day (QID) | ORAL | Status: DC | PRN
  Administered 2018-09-20: 1 via ORAL
  Filled 2018-09-20: qty 1

## 2018-09-20 NOTE — Discharge Instructions (Signed)
Patient advised to takes full liquid to soft easy to digest diet

## 2018-09-20 NOTE — Progress Notes (Signed)
Anticoagulation monitoring(Lovenox):  36yo  F ordered Lovenox 40 mg Q24h  Filed Weights   09/17/18 1113  Weight: 240 lb (108.9 kg)   BMI 44   Lab Results  Component Value Date   CREATININE 0.71 09/18/2018   CREATININE 0.85 09/17/2018   CREATININE 0.60 07/16/2018   Estimated Creatinine Clearance: 109.9 mL/min (by C-G formula based on SCr of 0.71 mg/dL). Hemoglobin & Hematocrit     Component Value Date/Time   HGB 13.0 09/18/2018 0406   HGB 13.1 04/18/2014 2353   HCT 39.1 09/18/2018 0406   HCT 39.2 04/18/2014 2353     Per Protocol for Patient with estCrcl > 30 ml/min and BMI > 40, will transition to Lovenox 40 mg Q12h.     Bari Mantis PharmD Clinical Pharmacist 09/20/2018

## 2018-09-20 NOTE — Discharge Summary (Addendum)
[facility] [place] [facility]   PATIENT NAME: Cassandra Cortez    MR#:  [id number]  DATE OF BIRTH:  1981-01-25  DATE OF ADMISSION:  09/17/2018 ADMITTING PHYSICIAN: Milagros Loll, MD  DATE OF DISCHARGE: 09/20/2018  PRIMARY CARE PHYSICIAN: [facility]    ADMISSION DIAGNOSIS:  Right upper quadrant abdominal pain [R10.11]  DISCHARGE DIAGNOSIS:  right upper quadrant abdominal pain--- unclear etiology  SECONDARY DIAGNOSIS:   Past Medical History:  Diagnosis Date  . Asthma     HOSPITAL COURSE:  Cassandra Cortez  is a 37 y.o. female with a known history of asthma presents to the emergency room due to recurrent right upper quadrant postprandial pain that has become constant since yesterday.  Patient has nausea and vomiting  *Right upper quadrant pain--etiology unclear CT and [place] normal. HIDA normal. EGD and colonscopy wnl. Nothing acute noted patient tolerated full liquid diet. She will get HIDA scan with the EF as outpatient. This was discussed with Dr. Tobi Bastos.  *Mild elevation in lipase. No signs of pancreatitis.  *Asthma. Albuterol as needed.  No vomiting. Discharge home with outpatient G.I. follow-up. CONSULTS OBTAINED:  Treatment Team:  [name], MD  DRUG ALLERGIES:   Allergies  Allergen Reactions  . Penicillins Swelling and Rash    Has patient had a PCN reaction causing immediate rash, facial/tongue/throat swelling, SOB or lightheadedness with hypotension: Yes Has patient had a PCN reaction causing severe rash involving mucus membranes or skin necrosis: No Has patient had a PCN reaction that required hospitalization No Has patient had a PCN reaction occurring within the last 10 years: No If all of the above answers are "NO", then may proceed with Cephalosporin use.  . Sulfasalazine Rash and Swelling  . Sulfa Antibiotics Swelling and Rash    DISCHARGE MEDICATIONS:   Allergies as of 09/20/2018      Reactions    Penicillins Swelling, Rash   Has patient had a PCN reaction causing immediate rash, facial/tongue/throat swelling, SOB or lightheadedness with hypotension: Yes Has patient had a PCN reaction causing severe rash involving mucus membranes or skin necrosis: No Has patient had a PCN reaction that required hospitalization No Has patient had a PCN reaction occurring within the last 10 years: No If all of the above answers are "NO", then may proceed with Cephalosporin use.   Sulfasalazine Rash, Swelling   Sulfa Antibiotics Swelling, Rash      Medication List    STOP taking these medications   famotidine 20 MG tablet Commonly known as:  PEPCID   ipratropium-albuterol 0.5-2.5 (3) MG/3ML Soln Commonly known as:  DUONEB   ondansetron 4 MG disintegrating tablet Commonly known as:  ZOFRAN-ODT     TAKE these medications   albuterol 108 (90 Base) MCG/ACT inhaler Commonly known as:  PROVENTIL HFA;VENTOLIN HFA Inhale 1-2 puffs every 6 (six) hours as needed into the lungs for wheezing or shortness of breath.   ondansetron 4 MG tablet Commonly known as:  ZOFRAN Take 1 tablet (4 mg total) by mouth every 8 (eight) hours as needed for nausea.   oxyCODONE-acetaminophen 5-325 MG tablet Commonly known as:  PERCOCET/ROXICET Take 1 tablet by mouth every 6 (six) hours as needed for moderate pain or severe pain. What changed:    when to take this  reasons to take this   pantoprazole 40 MG tablet Commonly known as:  PROTONIX Take 1 tablet (40 mg total) by mouth daily.       If you  experience worsening of your admission symptoms, develop shortness of breath, life threatening emergency, suicidal or homicidal thoughts you must seek medical attention immediately by calling 911 or calling your MD immediately  if symptoms less severe.  You Must read complete instructions/literature along with all the possible adverse reactions/side effects for all the Medicines you take and that have been prescribed  to you. Take any new Medicines after you have completely understood and accept all the possible adverse reactions/side effects.   Please note  You were cared for by a hospitalist during your hospital stay. If you have any questions about your discharge medications or the care you received while you were in the hospital after you are discharged, you can call the unit and asked to speak with the hospitalist on call if the hospitalist that took care of you is not available. Once you are discharged, your primary care physician will handle any further medical issues. Please note that NO REFILLS for any discharge medications will be authorized once you are discharged, as it is imperative that you return to your primary care physician (or establish a relationship with a primary care physician if you do not have one) for your aftercare needs so that they can reassess your need for medications and monitor your lab values. Today   SUBJECTIVE   Pain decreased in frequency and intensity. Able to tolerate clear liquid.  tolerated full liquid  VITAL SIGNS:  Blood pressure (!) 136/95, pulse 60, temperature 97.9 F (36.6 C), temperature source Oral, resp. rate 18, height 5' 1.5" (1.562 m), weight 108.9 kg, last menstrual period 09/14/2018, SpO2 95 %.  I/O:    Intake/Output Summary (Last 24 hours) at 09/20/2018 1722 Last data filed at 09/20/2018 0900 Gross per 24 hour  Intake 300 ml  Output 1 ml  Net 299 ml    PHYSICAL EXAMINATION:  GENERAL:  37 y.o.-year-old patient lying in the bed with no acute distress.obese  EYES: Pupils equal, round, reactive to light and accommodation. No scleral icterus. Extraocular muscles intact.  HEENT: Head atraumatic, normocephalic. Oropharynx and nasopharynx clear.  NECK:  Supple, no jugular venous distention. No thyroid enlargement, no tenderness.  LUNGS: Normal breath sounds bilaterally, no wheezing, rales,rhonchi or crepitation. No use of accessory muscles of  respiration.  CARDIOVASCULAR: S1, S2 normal. No murmurs, rubs, or gallops.  ABDOMEN: Soft, non-tender, non-distended. Bowel sounds present. No organomegaly or mass.  EXTREMITIES: No pedal edema, cyanosis, or clubbing.  NEUROLOGIC: Cranial nerves II through XII are intact. Muscle strength 5/5 in all extremities. Sensation intact. Gait not checked.  PSYCHIATRIC: The patient is alert and oriented x 3.  SKIN: No obvious rash, lesion, or ulcer.   DATA REVIEW:   CBC  Recent Labs  Lab 09/18/18 0406  WBC 10.5  HGB 13.0  HCT 39.1  PLT 324    Chemistries  Recent Labs  Lab 09/18/18 0406  NA 138  K 3.5  CL 105  CO2 26  GLUCOSE 98  BUN 6  CREATININE 0.71  CALCIUM 8.5*  AST 26  ALT 28  ALKPHOS 28*  BILITOT 0.7    Microbiology Results   No results found for this or any previous visit (from the past 240 hour(s)).  RADIOLOGY:  No results found.   Management plans discussed with the patient, family and they are in agreement.  CODE STATUS:     Code Status Orders  (From admission, onward)         Start     Ordered  09/17/18 1630  Full code  Continuous     09/17/18 1630        Code Status History    Date Active Date Inactive Code Status Order ID Comments User Context   10/19/2017 0357 10/22/2017 2252 Full Code [id number]  Arnaldo Natal, MD Inpatient   10/28/2015 1553 11/01/2015 1933 Full Code [id number]  Marguarite Arbour, MD Inpatient      TOTAL TIME TAKING CARE OF THIS PATIENT: *40* minutes.    Enedina Finner M.D on 09/20/2018 at 5:22 PM  Between 7am to 6pm - Pager - [phone] After 6pm go to [email or web address] - Social research officer, government  [facility]  Office  [phone]  CC: Primary care physician; [facility]

## 2018-09-21 LAB — SURGICAL PATHOLOGY

## 2018-09-26 ENCOUNTER — Other Ambulatory Visit: Payer: Self-pay

## 2018-09-26 ENCOUNTER — Emergency Department
Admission: EM | Admit: 2018-09-26 | Discharge: 2018-09-26 | Disposition: A | Discharge status: Home / Self Care | Payer: Medicaid Other | Attending: Emergency Medicine | Admitting: Emergency Medicine

## 2018-09-26 ENCOUNTER — Encounter: Payer: Self-pay | Admitting: Gastroenterology

## 2018-09-26 DIAGNOSIS — J45909 Unspecified asthma, uncomplicated: Secondary | ICD-10-CM | POA: Insufficient documentation

## 2018-09-26 DIAGNOSIS — R1010 Upper abdominal pain, unspecified: Secondary | ICD-10-CM

## 2018-09-26 DIAGNOSIS — Z79899 Other long term (current) drug therapy: Secondary | ICD-10-CM | POA: Diagnosis not present

## 2018-09-26 DIAGNOSIS — R1011 Right upper quadrant pain: Secondary | ICD-10-CM | POA: Diagnosis present

## 2018-09-26 LAB — CBC
HCT: 44.7 % (ref 36.0–46.0)
Hemoglobin: 14.9 g/dL (ref 12.0–15.0)
MCH: 30.5 pg (ref 26.0–34.0)
MCHC: 33.3 g/dL (ref 30.0–36.0)
MCV: 91.6 fL (ref 80.0–100.0)
PLATELETS: 336 10*3/uL (ref 150–400)
RBC: 4.88 MIL/uL (ref 3.87–5.11)
RDW: 12.9 % (ref 11.5–15.5)
WBC: 11.7 10*3/uL — ABNORMAL HIGH (ref 4.0–10.5)
nRBC: 0 % (ref 0.0–0.2)

## 2018-09-26 LAB — COMPREHENSIVE METABOLIC PANEL
ALK PHOS: 35 U/L — AB (ref 38–126)
ALT: 29 U/L (ref 0–44)
AST: 31 U/L (ref 15–41)
Albumin: 4.3 g/dL (ref 3.5–5.0)
Anion gap: 9 (ref 5–15)
BUN: 13 mg/dL (ref 6–20)
CALCIUM: 9.1 mg/dL (ref 8.9–10.3)
CO2: 21 mmol/L — ABNORMAL LOW (ref 22–32)
CREATININE: 0.74 mg/dL (ref 0.44–1.00)
Chloride: 107 mmol/L (ref 98–111)
GFR calc non Af Amer: >60 mL/min (ref >60)
GLUCOSE: 124 mg/dL — AB (ref 70–99)
Potassium: 4.1 mmol/L (ref 3.5–5.1)
SODIUM: 137 mmol/L (ref 135–145)
Total Bilirubin: 0.4 mg/dL (ref 0.3–1.2)
Total Protein: 7.8 g/dL (ref 6.5–8.1)

## 2018-09-26 LAB — LIPASE, BLOOD: Lipase: 56 U/L — ABNORMAL HIGH (ref 11–51)

## 2018-09-26 MED ORDER — KETOROLAC TROMETHAMINE 60 MG/2ML IM SOLN
60.0000 mg | Freq: Once | INTRAMUSCULAR | Status: AC
  Administered 2018-09-26: 60 mg via INTRAMUSCULAR
  Filled 2018-09-26: qty 2

## 2018-09-26 MED ORDER — GI COCKTAIL ~~LOC~~
30.0000 mL | Freq: Once | ORAL | Status: AC
  Administered 2018-09-26: 30 mL via ORAL
  Filled 2018-09-26: qty 30

## 2018-09-26 NOTE — ED Triage Notes (Signed)
Pt c/o right sided abd pain with N/V/D and was recently admitted for the same. States she is not getting any better.

## 2018-09-26 NOTE — ED Provider Notes (Signed)
[facility] Emergency Department Provider Note  Time seen: 11:41 AM  I have reviewed the triage vital signs and the nursing notes.   HISTORY  Chief Complaint Abdominal Pain    HPI Cassandra Cortez is a 37 y.o. female with a past medical history of abdominal pain, seizure disorder, asthma, presents to the emergency department for right upper quadrant abdominal pain.  According to the patient she has been expensing right upper quadrant abdominal pain intermittently over the past 5 months.  Patient states she has been seen in the emergency department multiple times for the same including in May, August and October.  Patient was recently admitted to the hospital for 3 days for her pain nausea and vomiting.  Patient had an extensive work-up performed including GI medicine consultation.  Had a normal CT scan, ultrasound, HIDA scan, endoscopy, colonoscopy.  Patient states when she was discharged 09/20/2018 she was given 10 pain pills, states they were gone within 2 to 3 days and she has been without any pain medication.  States the pain was worse today she did not know what else to do she tried over-the-counter medications which did not help so she came to the emergency department.   Past Medical History:  Diagnosis Date  . Asthma     Patient Active Problem List   Diagnosis Date Noted  . Right upper quadrant abdominal pain 09/17/2018  . Seizure (HCC) 07/16/2018  . Asthma exacerbation 10/28/2015  . Acute respiratory distress 10/28/2015  . Asthma 10/28/2015    Past Surgical History:  Procedure Laterality Date  . APPENDECTOMY    . CESAREAN SECTION    . COLONOSCOPY WITH PROPOFOL N/A 09/19/2018   Procedure: COLONOSCOPY WITH PROPOFOL;  Surgeon: [name], MD;  Location: [facility] ENDOSCOPY;  Service: Gastroenterology;  Laterality: N/A;  . ESOPHAGOGASTRODUODENOSCOPY (EGD) WITH PROPOFOL N/A 09/19/2018   Procedure: ESOPHAGOGASTRODUODENOSCOPY (EGD) WITH PROPOFOL;  Surgeon: [name], MD;  Location: [facility] ENDOSCOPY;  Service: Gastroenterology;  Laterality: N/A;  . LEEP      Prior to Admission medications   Medication Sig Start Date End Date Taking? Authorizing Provider  albuterol (PROVENTIL HFA;VENTOLIN HFA) 108 (90 Base) MCG/ACT inhaler Inhale 1-2 puffs every 6 (six) hours as needed into the lungs for wheezing or shortness of breath.    [provider]  ondansetron (ZOFRAN) 4 MG tablet Take 1 tablet (4 mg total) by mouth every 8 (eight) hours as needed for nausea. 09/20/18   Enedina Finner, MD  oxyCODONE-acetaminophen (PERCOCET/ROXICET) 5-325 MG tablet Take 1 tablet by mouth every 6 (six) hours as needed for moderate pain or severe pain. 09/20/18   Enedina Finner, MD  pantoprazole (PROTONIX) 40 MG tablet Take 1 tablet (40 mg total) by mouth daily. 09/20/18   Enedina Finner, MD    Allergies  Allergen Reactions  . Penicillins Swelling and Rash    Has patient had a PCN reaction causing immediate rash, facial/tongue/throat swelling, SOB or lightheadedness with hypotension: Yes Has patient had a PCN reaction causing severe rash involving mucus membranes or skin necrosis: No Has patient had a PCN reaction that required hospitalization No Has patient had a PCN reaction occurring within the last 10 years: No If all of the above answers are "NO", then may proceed with Cephalosporin use.  . Sulfasalazine Rash and Swelling  . Sulfa Antibiotics Swelling and Rash    Family History  Problem Relation Age of Onset  . Cancer Father 74       Colon cancer  .  Asthma Other     Social History Social History   Tobacco Use  . Smoking status: Never Smoker  . Smokeless tobacco: Never Used  Substance Use Topics  . Alcohol use: Not Currently  . Drug use: No    Review of Systems Constitutional: Negative for fever. Cardiovascular: Negative for chest pain. Respiratory: Negative for shortness of breath. Gastrointestinal: Positive for right upper quadrant abdominal pain.   Positive for nausea vomiting.  Positive for diarrhea. Genitourinary: Negative for urinary compaints Musculoskeletal: Negative for musculoskeletal complaints Skin: Negative for skin complaints  Neurological: Negative for headache All other ROS negative  ____________________________________________   PHYSICAL EXAM:  VITAL SIGNS: ED Triage Vitals [09/26/18 0921]  Enc Vitals Group     BP (!) 211/102     Pulse Rate 98     Resp 17     Temp 98.3 F (36.8 C)     Temp Source Oral     SpO2 99 %     Weight 240 lb (108.9 kg)     Height 5\' 1"  (1.549 m)     Head Circumference      Peak Flow      Pain Score 10     Pain Loc      Pain Edu?      Excl. in GC?     Constitutional: Alert and oriented. Well appearing and in no distress. Eyes: Normal exam ENT   Head: Normocephalic and atraumatic.   Mouth/Throat: Mucous membranes are moist. Cardiovascular: Normal rate, regular rhythm. No murmur Respiratory: Normal respiratory effort without tachypnea nor retractions. Breath sounds are clear  Gastrointestinal: Moderate right upper quadrant epigastric abdominal pain.  Abdomen otherwise largely benign.  No distention. Musculoskeletal: Nontender with normal range of motion in all extremities. Neurologic:  Normal speech and language. No gross focal neurologic deficits Skin:  Skin is warm, dry and intact.  Psychiatric: Mood and affect are normal.   ____________________________________________   INITIAL IMPRESSION / ASSESSMENT AND PLAN / ED COURSE  Pertinent labs & imaging results that were available during my care of the patient were reviewed by me and considered in my medical decision making (see chart for details).  Patient presents emergency department for continued right upper quadrant abdominal pain which has been ongoing for the past 5 months but getting worse per patient.  Patient has follow-up with GI medicine on Wednesday for further testing.  Has had an extensive negative  work-up including CT scan, ultrasound, endoscopy, colonoscopy, HIDA scan.  Patient's labs today are largely within normal limits continues to show a very slight lipase elevation although on CT scan no evidence of acute pancreatitis.  Patient's labs today are largely within normal limits slight leukocytosis of 11,700 lipase remains slightly elevated, LFTs are normal.  Do not believe further imaging is warranted at this time given such a significant work-up that has shown largely negative results.  We will dose IM Toradol, GI cocktail and monitor in the emergency department.  Patient agreeable to plan of care.  Patient states she is feeling better after GI cocktail and Toradol.  Does not wish for any pain medication going home.  States she will follow-up with GI medicine this week as scheduled.  ____________________________________________   FINAL CLINICAL IMPRESSION(S) / ED DIAGNOSES  Upper abdominal pain    Minna Antis, MD 09/26/18 1333

## 2018-09-27 ENCOUNTER — Telehealth: Payer: Self-pay | Admitting: Gastroenterology

## 2018-09-27 NOTE — Telephone Encounter (Signed)
Melanie, please schedule for 10/04/18 at 9:30am for Dr. Maximino Greenland. If she cannot do that she can keep her app for Nov 6th.   Thank you.

## 2018-09-27 NOTE — Telephone Encounter (Signed)
Patient called and states she was in the ED and had a Colonoscopy & Upper Endoscopy by Dr Tobi Bastos on 09-19-18. She needs a return appointment with Dr Tobi Bastos per her discharge orders from the ED on 09-26-18 & discharge from inpatient on 09-20-18 states she should return to see  Dr Tobi Bastos in 1 week.  She currently has a 3 mon f/u with Dr Maximino Greenland. Please advise who & when she needs to be seen.

## 2018-09-29 ENCOUNTER — Telehealth: Payer: Self-pay | Admitting: Gastroenterology

## 2018-09-29 NOTE — Telephone Encounter (Signed)
I have called patient and l/m asking her to call to se if she was able to move her appointment to 10-04-18 @ 9:30am per the request of Fenton Malling.

## 2018-09-30 ENCOUNTER — Ambulatory Visit: Payer: Medicaid Other | Admitting: Gastroenterology

## 2018-10-01 NOTE — Telephone Encounter (Signed)
I have called patient and l/m  ask patient to call if she would like to reschedule.

## 2018-10-20 ENCOUNTER — Ambulatory Visit: Payer: Medicaid Other | Admitting: Gastroenterology

## 2018-10-20 ENCOUNTER — Encounter: Payer: Self-pay | Admitting: Gastroenterology

## 2018-10-20 DIAGNOSIS — R1011 Right upper quadrant pain: Secondary | ICD-10-CM

## 2019-01-31 ENCOUNTER — Ambulatory Visit: Admit: 2019-01-31 | Discharge: 2019-02-01 | Payer: MEDICAID

## 2019-01-31 DIAGNOSIS — Z79899 Other long term (current) drug therapy: Secondary | ICD-10-CM

## 2019-01-31 DIAGNOSIS — D492 Neoplasm of unspecified behavior of bone, soft tissue, and skin: Secondary | ICD-10-CM

## 2019-01-31 DIAGNOSIS — L409 Psoriasis, unspecified: Secondary | ICD-10-CM

## 2019-01-31 DIAGNOSIS — L400 Psoriasis vulgaris: Principal | ICD-10-CM

## 2019-01-31 MED ORDER — CLOBETASOL 0.05 % SCALP SOLUTION
6 refills | 0 days | Status: CP
Start: 2019-01-31 — End: ?

## 2019-01-31 MED ORDER — HYDROXYZINE HCL 10 MG TABLET
ORAL_TABLET | 2 refills | 0 days | Status: CP
Start: 2019-01-31 — End: ?

## 2019-01-31 MED ORDER — HUMIRA PEN CITRATE FREE STARTER PACK FOR PS/UV 1X 80 MG/0.8 ML, 2X 40 MG/0.4 ML
0 refills | 0 days | Status: CP
Start: 2019-01-31 — End: ?

## 2019-01-31 MED ORDER — CLOBETASOL 0.05 % TOPICAL OINTMENT
5 refills | 0 days | Status: CP
Start: 2019-01-31 — End: ?

## 2019-01-31 MED ORDER — HUMIRA SYRINGE CITRATE FREE 40 MG/0.4 ML
SUBCUTANEOUS | 11 refills | 0.00000 days | Status: CP
Start: 2019-01-31 — End: ?

## 2019-01-31 NOTE — Unmapped (Addendum)
Assessment and Plan:    Jean Gates was seen today for lesion of concern and psoriasis.    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 She reports that psoriasis never completely clears even when she uses topicals daily.  - Placed     Ambulatory referral to Rheumatology; to r/o joint involvement  - continue    hydrOXYzine (ATARAX) 10 MG tablet; Take 1-2 pills nightly as needed.  - restart    clobetasoL (TEMOVATE) 0.05 % ointment; Apply to affected area twice a day until smooth  -  restart  clobetasoL (TEMOVATE) 0.05 % external solution; Apply to affected area twice a day on the scalp until smooth  -  If labs ok, start  HUMIRA PEN CITRATE FREE STARTER PACK FOR PS/UV 1X 80 MG/0.8 ML, 2X 40 MG/0.4 ML; 80 mg x 1, then 40 mg every other week beginning 1 week after initial dose  -     HUMIRA SYRINGE CITRATE FREE 40 MG/0.4 ML; Inject 0.4 mL (40 mg total) under the skin every fourteen (14) days.    High risk medication use  -     Quantiferon TB Gold Plus; Future  -     Creatinine; Future  -     Hepatitis B Core Antibody, total; Future  -     Hepatitis B Surface Antibody; Future  -     Hepatitis B Surface Antigen; Future  -     Hepatitis C Antibody; Future  -     CBC w/ Differential; Future  -     AST; Future  -     ALT; Future  -     BUN; Future    Neoplasm of skin:  - Lesion A: 5mm keratotic erythematous cup shaped papule with central necrotic debris  - DDX: keratoacanthoma v verruca v sk  - After verbal consent was obtained, the area was prepped and anesthetized with lidocaine 2% with epinephrine. Biopsy was performed using a shave technique and hemostasis was achieved with pressure and aluminum chloride. Bandage was applied and instructions were provided. I will call patient with the results.    RTC 3 months    Chief Complaint: psoriasis     HPI: Jean Gates  is a 38 y.o. female last seen by Dr. Fayrene Fearing in 12/2016 here for f/u of psoriasis and lesion of concern. Concerns:  - Psoriasis affecting the lower legs, scalp, buttocks and intergluteal fold. Plaques are painful and itchy and drop scale everywhere. She is out of topical medications, but reports that even with regular use, skin never completely clears. Also reports joint pains in the hips and shoulders that is worse in the morning. Has never been evaluated for psoriatic arthritis. Denies history of congestive heart failure, malignancy, liver/kidney disease, TB infection, guillain barre, or inflammatory bowel disease.   - also notes new rough bump on the left upper lip. This has been present for 4-6 weeks. Very rough and scaly. Grew quite fast.   There are no other skin concerns reported today.    Past Medical History:   -No history of skin cancer.    FH/SH: reviewed in Epic  -No family history of melanoma.    Medications: reviewed in Epic   Allergies: reviewed in epic    ROS: No fevers, chills, or other systemic or skin complaints.  Physical Examination:  General: Well-developed, well-nourished female in no acute distress, resting comfortably.  Neuro: A, A, Ox 3. Answers questions appropriately.  Skin: Examination of the face, neck, chest, abdomen, back, bilateral upper extremities, bilateral lower extremities, palms, and nails was performed and notable for the following. All other areas not specifically commented on are within normal limits.  - Scalp with multiple well circumscribed erythematous plaques with overlying silvery scale.  - Multiple well circumscribed erythematous papules and plaques with overlying silvery scale on gluteal cleft and bilateral calves   _- Lesion A: 5mm keratotic erythematous cup shaped papule with central necrotic debris  _____    ________________________________________________________________  The patient was seen and examined by Dr. Orma Flaming who agrees with the assessment and plan as above.

## 2019-02-01 NOTE — Unmapped (Signed)
Per test claims for Humira loading and maintenance at the [facility], patient needs Medication Assistance Program for Prior Authorization.

## 2019-02-02 NOTE — Unmapped (Signed)
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 This lesion requires definitive treatment. Patient will be referred to Dr. Lorn Junes & Dr. Sallee Lange for consideration of Mohs surgery due to the location and ill-defined nature of the lesion.  Patient will be called by our office to schedule an appointment. Patient voices understanding of the above. Patient instructed to call our office at [phone] with any additional questions.    Referral submitted.requires tracking

## 2019-02-15 NOTE — Unmapped (Signed)
Attempted to contact patient to discuss denial of humira No answer and voicemail box full. Will attempt to reach patient next business day

## 2019-05-18 NOTE — Unmapped (Signed)
Letter sent 05/18/19

## 2019-05-26 NOTE — Unmapped (Signed)
Letter received on 05/21/19

## 2019-10-17 IMAGING — US US ABDOMEN LIMITED
1 series · 14 of 25 positions shown · non-contrast
Comparison: CT abdomen and pelvis 09/09/2011.

CLINICAL DATA: Right upper quadrant pain and vomiting for 2 days.

EXAM:
ULTRASOUND ABDOMEN LIMITED RIGHT UPPER QUADRANT

[Series 1: us abdomen limited · 0.25mm/px · 14 of 45 slices shown]
[im 1/45]
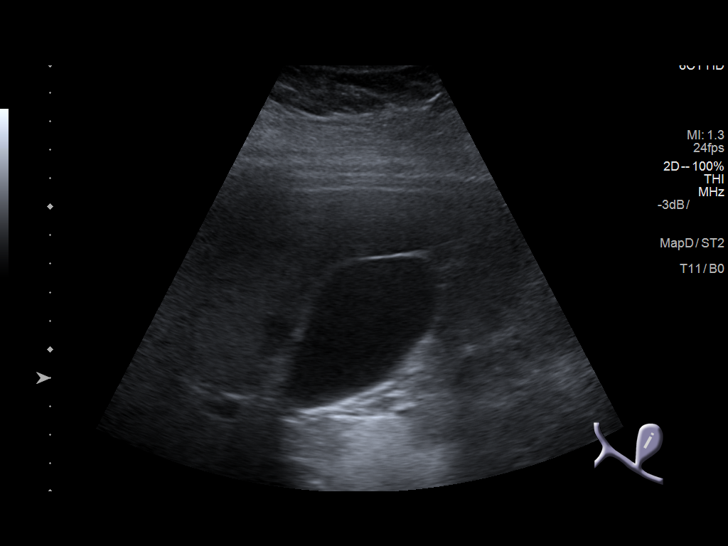
[im 4/45]
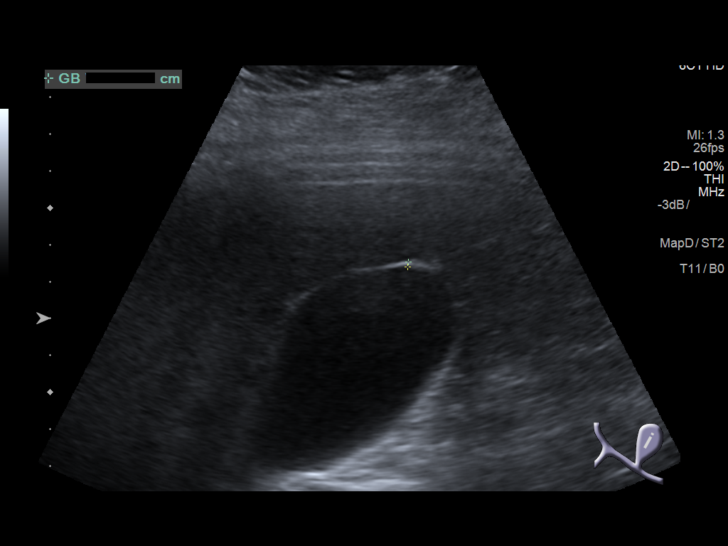
[im 8/45]
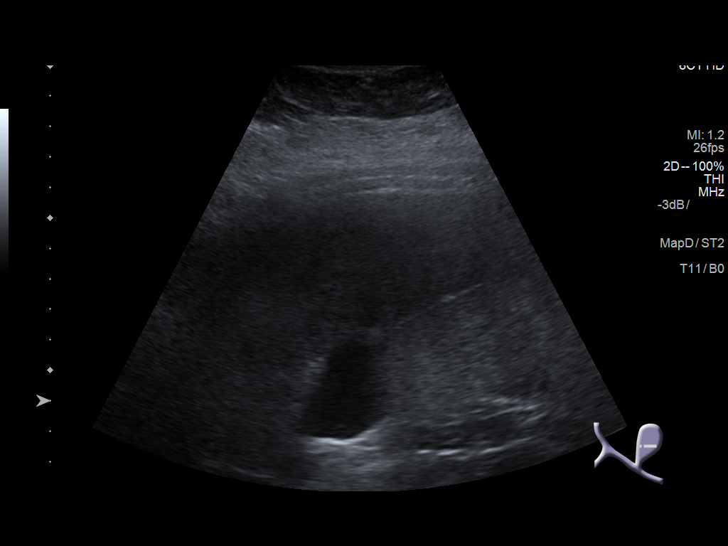
[im 12/45]
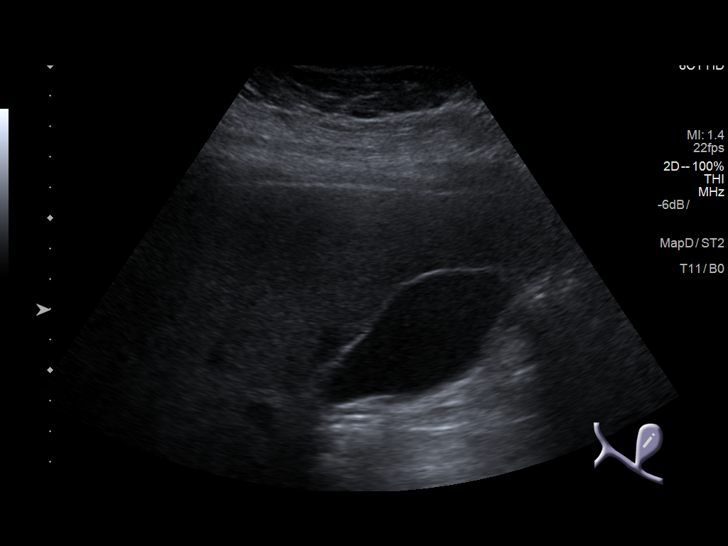
[im 15/45]
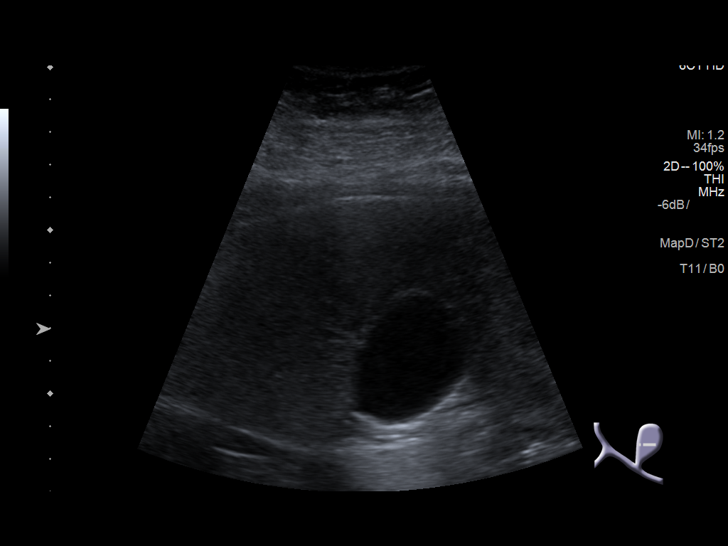
[im 17/45]
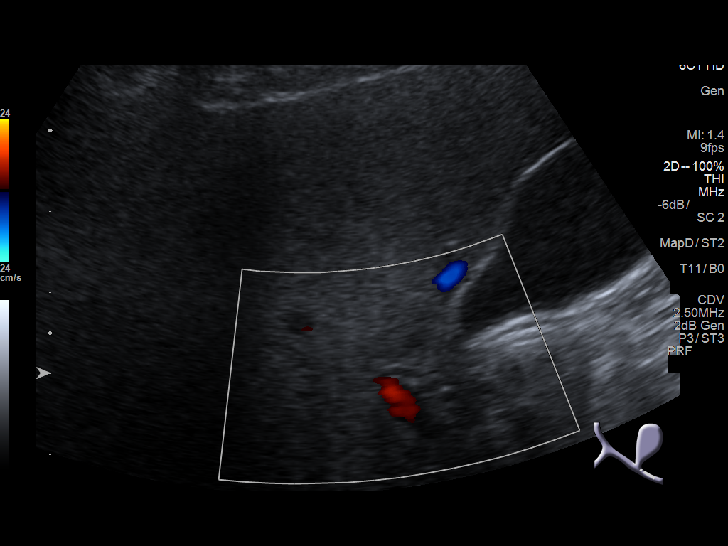
[im 21/45]
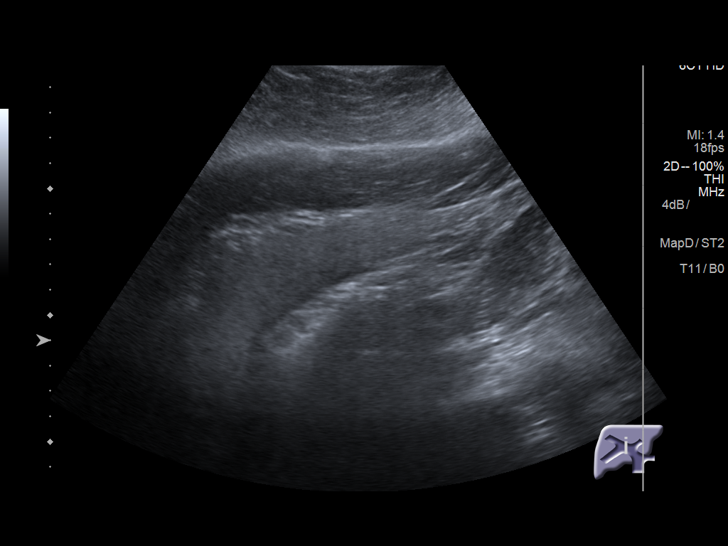
[im 24/45]
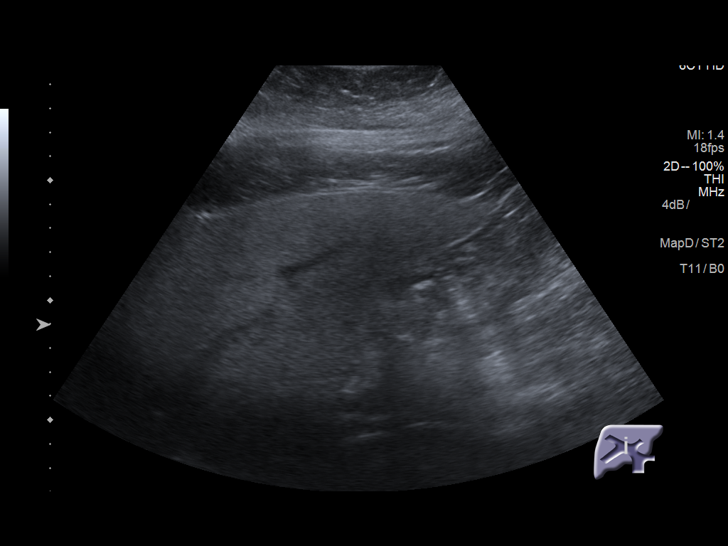
[im 28/45]
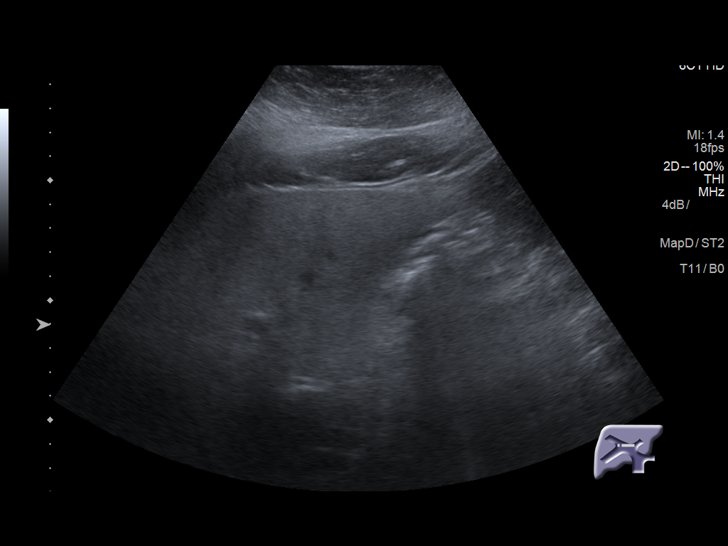
[im 30/45]
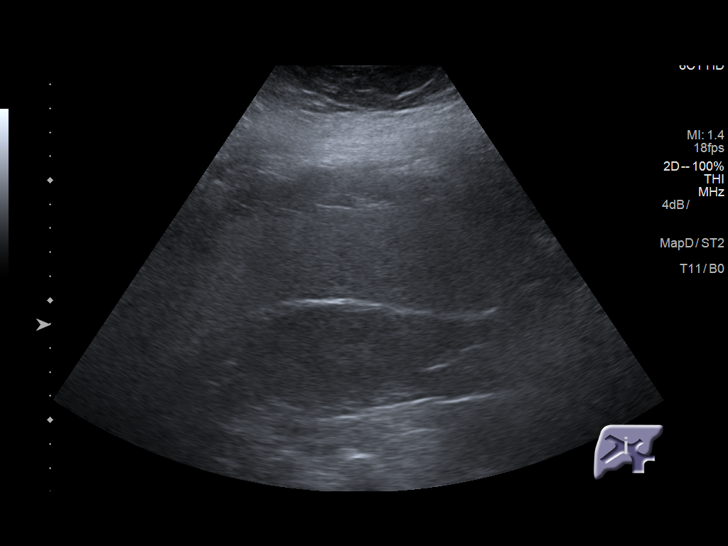
[im 34/45]
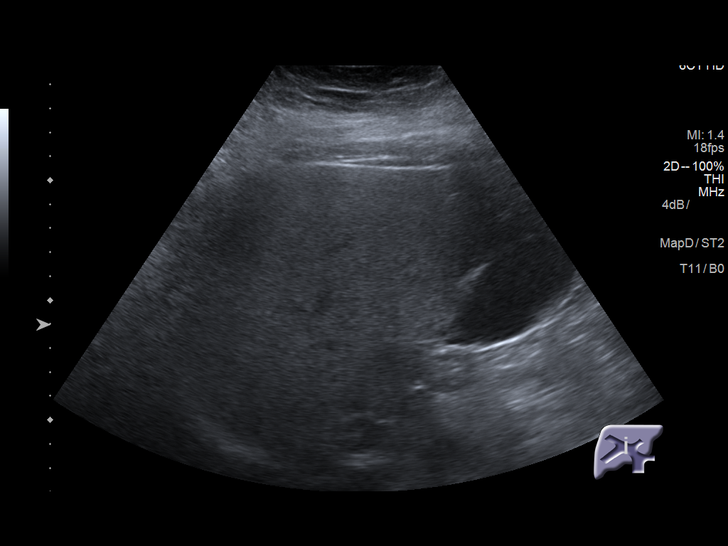
[im 37/45]
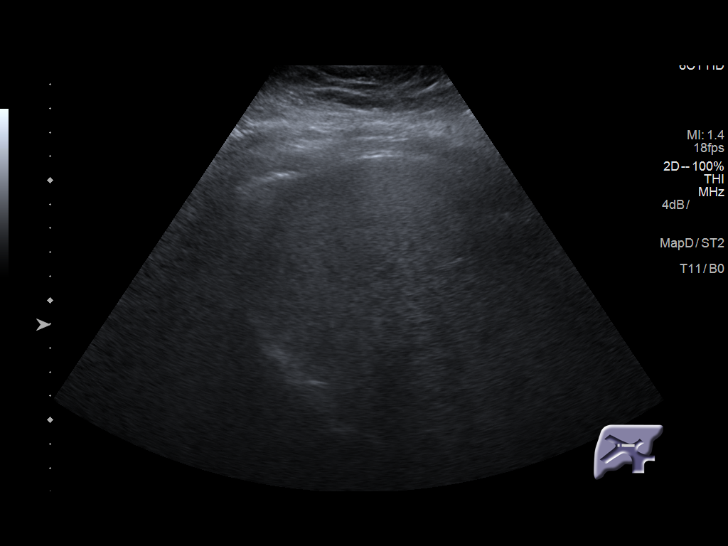
[im 41/45]
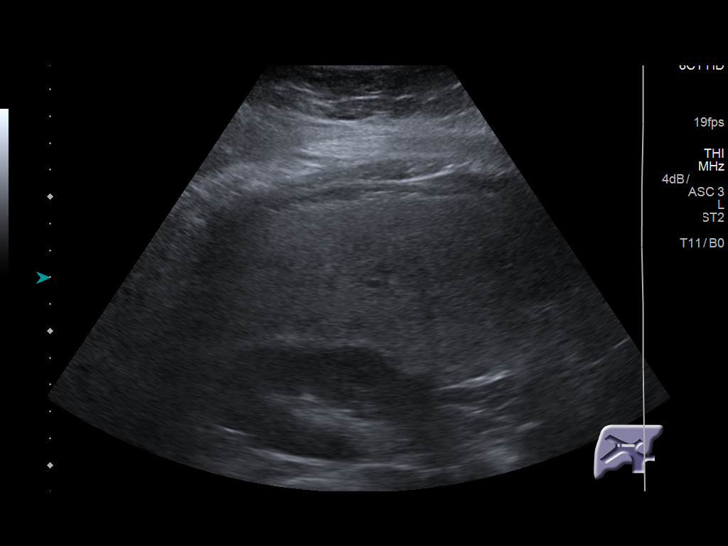
[im 45/45]
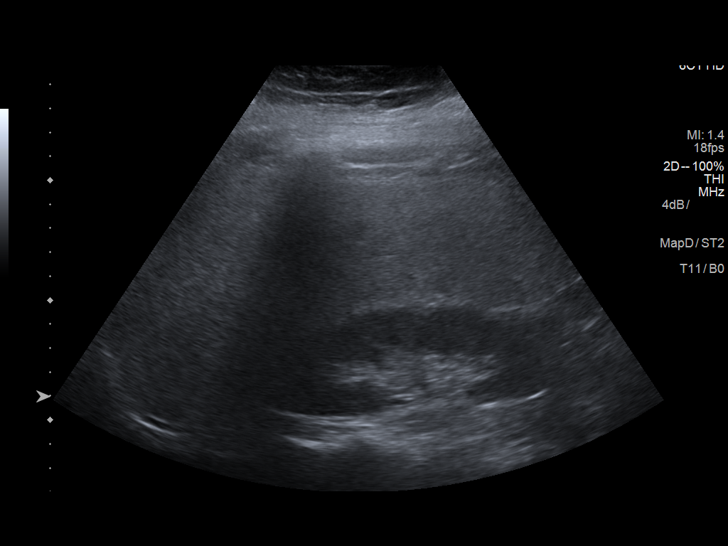

[14 of 25 positions shown; findings below may reference images not displayed]

FINDINGS: Gallbladder:

No gallstones or wall thickening visualized. No sonographic Murphy
sign noted by sonographer.

Common bile duct:

Diameter: 0.2 cm

Liver:

No focal lesion. Echogenicity is increased. Portal vein is patent on
color Doppler imaging with normal direction of blood flow towards
the liver.
IMPRESSION: Negative for gallstones.  No acute abnormality.

Fatty infiltration of the liver.

## 2020-08-28 ENCOUNTER — Emergency Department: Payer: Medicaid Other

## 2020-08-28 ENCOUNTER — Other Ambulatory Visit: Payer: Self-pay

## 2020-08-28 ENCOUNTER — Encounter: Payer: Self-pay | Admitting: Emergency Medicine

## 2020-08-28 ENCOUNTER — Emergency Department
Admission: EM | Admit: 2020-08-28 | Discharge: 2020-08-28 | Disposition: A | Discharge status: Home / Self Care | Payer: Medicaid Other | Attending: Emergency Medicine | Admitting: Emergency Medicine

## 2020-08-28 DIAGNOSIS — Z20822 Contact with and (suspected) exposure to covid-19: Secondary | ICD-10-CM | POA: Insufficient documentation

## 2020-08-28 DIAGNOSIS — Z5321 Procedure and treatment not carried out due to patient leaving prior to being seen by health care provider: Secondary | ICD-10-CM | POA: Insufficient documentation

## 2020-08-28 DIAGNOSIS — J029 Acute pharyngitis, unspecified: Secondary | ICD-10-CM | POA: Diagnosis present

## 2020-08-28 LAB — BASIC METABOLIC PANEL
Anion gap: 7 (ref 5–15)
BUN: 13 mg/dL (ref 6–20)
CO2: 25 mmol/L (ref 22–32)
Calcium: 9 mg/dL (ref 8.9–10.3)
Chloride: 105 mmol/L (ref 98–111)
Creatinine, Ser: 0.75 mg/dL (ref 0.44–1.00)
GFR calc Af Amer: >60 mL/min (ref >60)
GFR calc non Af Amer: >60 mL/min (ref >60)
Glucose, Bld: 102 mg/dL — ABNORMAL HIGH (ref 70–99)
Potassium: 3.8 mmol/L (ref 3.5–5.1)
Sodium: 137 mmol/L (ref 135–145)

## 2020-08-28 LAB — CBC
HCT: 40.6 % (ref 36.0–46.0)
Hemoglobin: 13.7 g/dL (ref 12.0–15.0)
MCH: 29.9 pg (ref 26.0–34.0)
MCHC: 33.7 g/dL (ref 30.0–36.0)
MCV: 88.6 fL (ref 80.0–100.0)
Platelets: 364 10*3/uL (ref 150–400)
RBC: 4.58 MIL/uL (ref 3.87–5.11)
RDW: 14.2 % (ref 11.5–15.5)
WBC: 11.5 10*3/uL — ABNORMAL HIGH (ref 4.0–10.5)
nRBC: 0 % (ref 0.0–0.2)

## 2020-08-28 LAB — RESPIRATORY PANEL BY RT PCR (FLU A&B, COVID)
Influenza A by PCR: NEGATIVE
Influenza B by PCR: NEGATIVE
SARS Coronavirus 2 by RT PCR: NEGATIVE

## 2020-08-28 NOTE — ED Triage Notes (Signed)
Pt to ED from home c/o sore throat, body aches, fevers at home since yesterday.  Positive exposure to COVID at friend's house.  Denies having vaccine.  Pt with strong dry cough in triage.  VORB from Dr. Darnelle Catalan, orders placed.

## 2020-10-30 ENCOUNTER — Ambulatory Visit (INDEPENDENT_AMBULATORY_CARE_PROVIDER_SITE_OTHER): Payer: Medicaid Other

## 2020-10-30 ENCOUNTER — Ambulatory Visit
Admission: RE | Admit: 2020-10-30 | Discharge: 2020-10-30 | Disposition: A | Discharge status: Home / Self Care | Payer: Medicaid Other | Source: Ambulatory Visit | Attending: Emergency Medicine | Admitting: Emergency Medicine

## 2020-10-30 ENCOUNTER — Other Ambulatory Visit: Payer: Self-pay

## 2020-10-30 VITALS — BP 150/94 | HR 95 | Temp 99.0°F | Resp 18 | Ht 61.0 in | Wt 220.0 lb

## 2020-10-30 DIAGNOSIS — S5011XA Contusion of right forearm, initial encounter: Secondary | ICD-10-CM

## 2020-10-30 DIAGNOSIS — M25511 Pain in right shoulder: Secondary | ICD-10-CM | POA: Diagnosis not present

## 2020-10-30 DIAGNOSIS — M25521 Pain in right elbow: Secondary | ICD-10-CM | POA: Diagnosis not present

## 2020-10-30 DIAGNOSIS — S40011A Contusion of right shoulder, initial encounter: Secondary | ICD-10-CM | POA: Diagnosis not present

## 2020-10-30 MED ORDER — HYDROCODONE-ACETAMINOPHEN 5-325 MG PO TABS: 2.0000 | ORAL_TABLET | Freq: Four times a day (QID) | ORAL | 0 refills | Status: DC | PRN

## 2020-10-30 NOTE — ED Provider Notes (Signed)
[facility]    CSN: [id number] Arrival date & time: 10/30/20  1844      History   Chief Complaint Chief Complaint  Patient presents with  . Elbow Pain  . Shoulder Pain    HPI Cassandra Cortez is a 39 y.o. female.   HPI   Patient is here for evaluation of right elbow and shoulder pain.  Patient reports that she slipped on some steps 2 days ago landed on her bottom and then impacted her right elbow and her right shoulder while sliding down the stairs.  Patient denies head injury or loss of conscious.  Patient has had decreased grips and some numbness and tingling in her fingers if she lets her arm hang down.  Patient cannot fully extend her right arm or lift her right upper arm away from her body.  Patient has been using heat and naproxen at home with minimal relief from pain  Past Medical History:  Diagnosis Date  . Asthma     Patient Active Problem List   Diagnosis Date Noted  . Right upper quadrant abdominal pain 09/17/2018  . Seizure (HCC) 07/16/2018  . Asthma exacerbation 10/28/2015  . Acute respiratory distress 10/28/2015  . Asthma 10/28/2015    Past Surgical History:  Procedure Laterality Date  . APPENDECTOMY    . CESAREAN SECTION    . COLONOSCOPY WITH PROPOFOL N/A 09/19/2018   Procedure: COLONOSCOPY WITH PROPOFOL;  Surgeon: [name], MD;  Location: [facility] ENDOSCOPY;  Service: Gastroenterology;  Laterality: N/A;  . ESOPHAGOGASTRODUODENOSCOPY (EGD) WITH PROPOFOL N/A 09/19/2018   Procedure: ESOPHAGOGASTRODUODENOSCOPY (EGD) WITH PROPOFOL;  Surgeon: [name], MD;  Location: [facility] ENDOSCOPY;  Service: Gastroenterology;  Laterality: N/A;  . LEEP      OB History   No obstetric history on file.      Home Medications    Prior to Admission medications   Medication Sig Start Date End Date Taking? Authorizing Provider  HYDROcodone-acetaminophen (NORCO/VICODIN) 5-325 MG tablet Take 2 tablets by mouth every 6 (six) hours as needed. 10/30/20   Becky Augusta, NP  pantoprazole (PROTONIX) 40 MG tablet Take 1 tablet (40 mg total) by mouth daily. 09/20/18 10/30/20  Enedina Finner, MD    Family History Family History  Problem Relation Age of Onset  . Cancer Father 50       Colon cancer  . Asthma Other   . Healthy Mother     Social History Social History   Tobacco Use  . Smoking status: Never Smoker  . Smokeless tobacco: Never Used  Substance Use Topics  . Alcohol use: Not Currently  . Drug use: No     Allergies   Penicillins, Sulfasalazine, and Sulfa antibiotics   Review of Systems Review of Systems  Constitutional: Negative for activity change, appetite change and fever.  Respiratory: Negative for cough and shortness of breath.   Cardiovascular: Negative for chest pain.  Gastrointestinal: Negative for nausea and vomiting.  Musculoskeletal: Positive for arthralgias and myalgias.  Skin: Negative for color change and wound.  Neurological: Positive for weakness and numbness. Negative for dizziness, syncope and headaches.  Hematological: Negative.   Psychiatric/Behavioral: Negative.      Physical Exam Triage Vital Signs ED Triage Vitals  Enc Vitals Group     BP 10/30/20 1912 (!) 150/94     Pulse Rate 10/30/20 1912 95     Resp 10/30/20 1912 18     Temp 10/30/20 1912 99 F (37.2 C)     Temp Source  10/30/20 1912 Oral     SpO2 10/30/20 1912 99 %     Weight 10/30/20 1910 220 lb 0.3 oz (99.8 kg)     Height 10/30/20 1910 5\' 1"  (1.549 m)     Head Circumference --      Peak Flow --      Pain Score 10/30/20 1910 8     Pain Loc --      Pain Edu? --      Excl. in GC? --    No data found.  Updated Vital Signs BP (!) 150/94 (BP Location: Right Arm)   Pulse 95   Temp 99 F (37.2 C) (Oral)   Resp 18   Ht 5\' 1"  (1.549 m)   Wt 220 lb 0.3 oz (99.8 kg)   LMP 10/28/2020   SpO2 99%   BMI 41.57 kg/m   Visual Acuity Right Eye Distance:   Left Eye Distance:   Bilateral Distance:    Right Eye Near:   Left Eye Near:      Bilateral Near:     Physical Exam Vitals and nursing note reviewed.  Constitutional:      General: She is not in acute distress.    Appearance: Normal appearance. She is not toxic-appearing.  HENT:     Head: Normocephalic and atraumatic.  Eyes:     General: No scleral icterus.    Extraocular Movements: Extraocular movements intact.     Conjunctiva/sclera: Conjunctivae normal.     Pupils: Pupils are equal, round, and reactive to light.  Cardiovascular:     Rate and Rhythm: Normal rate and regular rhythm.     Pulses: Normal pulses.     Heart sounds: Normal heart sounds. No murmur heard.  No gallop.   Pulmonary:     Effort: Pulmonary effort is normal.     Breath sounds: Normal breath sounds. No wheezing or rales.  Musculoskeletal:        General: Tenderness present. No swelling or deformity.  Skin:    General: Skin is warm and dry.     Capillary Refill: Capillary refill takes less than 2 seconds.     Findings: No bruising or erythema.  Neurological:     General: No focal deficit present.     Mental Status: She is alert and oriented to person, place, and time.  Psychiatric:        Mood and Affect: Mood normal.        Behavior: Behavior normal.        Thought Content: Thought content normal.        Judgment: Judgment normal.      UC Treatments / Results  Labs (all labs ordered are listed, but only abnormal results are displayed) Labs Reviewed - No data to display  EKG   Radiology DG Shoulder Right  Result Date: 10/30/2020 CLINICAL DATA:  39 year old female with fall and right shoulder pain. EXAM: RIGHT SHOULDER - 2+ VIEW COMPARISON:  Chest radiograph dated 08/28/2020. FINDINGS: There is no evidence of fracture or dislocation. There is no evidence of arthropathy or other focal bone abnormality. Soft tissues are unremarkable. IMPRESSION: Negative. Electronically Signed   By: 44 M.D.   On: 10/30/2020 20:15   DG Elbow Complete Right  Result Date:  10/30/2020 CLINICAL DATA:  Pain and limited range of motion after falling on Sunday. EXAM: RIGHT ELBOW - COMPLETE 3+ VIEW COMPARISON:  None. FINDINGS: No acute fracture or dislocation.  No joint effusion. IMPRESSION: No acute osseous abnormality.  Electronically Signed   By: Jeronimo Greaves M.D.   On: 10/30/2020 20:15    Procedures Procedures (including critical care time)  Medications Ordered in UC Medications - No data to display  Initial Impression / Assessment and Plan / UC Course  I have reviewed the triage vital signs and the nursing notes.  Pertinent labs & imaging results that were available during my care of the patient were reviewed by me and considered in my medical decision making (see chart for details).   Patient is here for evaluation of right shoulder and elbow pain after falling down the stairs 2 days ago.  Patient states that if she keeps her arm at 90 degrees the pain is better but if she tries to extend her arm in the end of the hand down she develops numbness and tingling in her fingers.  Patient is having pain over the olecranon process to palpation as well as the proximal ulna.  Patient's arm is kept in a neutral position she cannot fully supinate or pronate her right forearm.  There is tenderness over the radial head.  Radial and ulnar pulses are 2+ and patient has a cap refill less than 2 seconds.  Patient's grips are 3/5.  Patient cannot extend her right arm fully.  Patient can also not flex her forearm past 90 degrees.  No tenderness to palpation of the shaft of the humerus but there is tenderness over the lateral epicondyle of the distal humerus on the right.  Patient also has tenderness in the posterior aspect of the right shoulder girdle.  No acromion tenderness, clavicular tenderness, or scapular spine tenderness.  Suspect soft tissue injury.  Will obtain x-rays of right elbow and shoulder.  X-rays of elbow and shoulder are negative for fracture or dislocation.  Will fit  patient with sling, diagnosis right elbow and shoulder contusion, and will DC home with analgesia.   Final Clinical Impressions(s) / UC Diagnoses   Final diagnoses:  Contusion of right shoulder, initial encounter  Contusion of right elbow and forearm, initial encounter     Discharge Instructions     Wear the sling for the next 2 to 3 days.  You can continue with your moist heat for 20 to 30 minutes at a time 2-3 times a day to your elbow and your shoulder.  Continue to take over-the-counter ibuprofen, 600 mg every 6 hours with food, for your elbow and shoulder pain.  Use the Norco as needed for severe pain.  If your pain continues follow-up with your primary care provider or with orthopedics.      ED Prescriptions    Medication Sig Dispense Auth. Provider   HYDROcodone-acetaminophen (NORCO/VICODIN) 5-325 MG tablet Take 2 tablets by mouth every 6 (six) hours as needed. 10 tablet Becky Augusta, NP     I have reviewed the PDMP during this encounter.   Becky Augusta, NP 10/30/20 2029

## 2020-10-30 NOTE — Discharge Instructions (Addendum)
Wear the sling for the next 2 to 3 days.  You can continue with your moist heat for 20 to 30 minutes at a time 2-3 times a day to your elbow and your shoulder.  Continue to take over-the-counter ibuprofen, 600 mg every 6 hours with food, for your elbow and shoulder pain.  Use the Norco as needed for severe pain.  If your pain continues follow-up with your primary care provider or with orthopedics.

## 2020-10-30 NOTE — ED Triage Notes (Signed)
Patient states she fell down her stairs on Sunday and is c/o of pain is her right elbow and right shoulder. 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

## 2021-01-14 ENCOUNTER — Other Ambulatory Visit: Payer: Self-pay

## 2021-01-14 ENCOUNTER — Ambulatory Visit
Admission: EM | Admit: 2021-01-14 | Discharge: 2021-01-14 | Disposition: A | Discharge status: Home / Self Care | Payer: Medicaid Other | Attending: Physician Assistant | Admitting: Physician Assistant

## 2021-01-14 DIAGNOSIS — R110 Nausea: Secondary | ICD-10-CM

## 2021-01-14 DIAGNOSIS — U071 COVID-19: Secondary | ICD-10-CM | POA: Insufficient documentation

## 2021-01-14 DIAGNOSIS — J45909 Unspecified asthma, uncomplicated: Secondary | ICD-10-CM | POA: Diagnosis not present

## 2021-01-14 DIAGNOSIS — B349 Viral infection, unspecified: Secondary | ICD-10-CM

## 2021-01-14 DIAGNOSIS — Z882 Allergy status to sulfonamides status: Secondary | ICD-10-CM | POA: Diagnosis not present

## 2021-01-14 DIAGNOSIS — Z79899 Other long term (current) drug therapy: Secondary | ICD-10-CM | POA: Diagnosis not present

## 2021-01-14 DIAGNOSIS — R509 Fever, unspecified: Secondary | ICD-10-CM | POA: Diagnosis present

## 2021-01-14 DIAGNOSIS — R059 Cough, unspecified: Secondary | ICD-10-CM

## 2021-01-14 DIAGNOSIS — Z880 Allergy status to penicillin: Secondary | ICD-10-CM | POA: Diagnosis not present

## 2021-01-14 MED ORDER — BENZONATATE 100 MG PO CAPS: 200.0000 mg | ORAL_CAPSULE | Freq: Three times a day (TID) | ORAL | 0 refills | Status: AC | PRN

## 2021-01-14 MED ORDER — ONDANSETRON 4 MG PO TBDP: 4.0000 mg | ORAL_TABLET | Freq: Three times a day (TID) | ORAL | 0 refills | Status: DC | PRN

## 2021-01-14 MED ORDER — PREDNISONE 20 MG PO TABS: 40.0000 mg | ORAL_TABLET | Freq: Every day | ORAL | 0 refills | Status: AC

## 2021-01-14 NOTE — ED Provider Notes (Signed)
[facility]    CSN: [id number] Arrival date & time: 01/14/21  1640      History   Chief Complaint Chief Complaint  Patient presents with  . Fever  . Generalized Body Aches    HPI Cassandra Cortez is a 40 y.o. female presenting for onset of fever up to 102 degrees, fatigue, body aches, chills, runny nose/nasal congestion and cough yesterday.  Patient has been exposed to COVID-19 through her 8-year-old son.  She has not been vaccinated for COVID-19.  Past medical history is significant for asthma.  Patient says that her chest feels little tight, but she denies any wheezing or breathing difficulty.  She has been using her maintenance asthma inhaler and nebulizer as needed for shortness of breath and chest tightness.  She has been taking over-the-counter ibuprofen and Tylenol for fever, but no other over-the-counter medications.  Patient states that she is nauseous but has not had any vomiting and denies diarrhea.  Patient states that she is trying to drink fluids, but feels too nauseous.  No other complaints or concerns.  HPI  Past Medical History:  Diagnosis Date  . Asthma     Patient Active Problem List   Diagnosis Date Noted  . Right upper quadrant abdominal pain 09/17/2018  . Seizure (HCC) 07/16/2018  . Asthma exacerbation 10/28/2015  . Acute respiratory distress 10/28/2015  . Asthma 10/28/2015    Past Surgical History:  Procedure Laterality Date  . APPENDECTOMY    . CESAREAN SECTION    . COLONOSCOPY WITH PROPOFOL N/A 09/19/2018   Procedure: COLONOSCOPY WITH PROPOFOL;  Surgeon: [name], MD;  Location: [facility] ENDOSCOPY;  Service: Gastroenterology;  Laterality: N/A;  . ESOPHAGOGASTRODUODENOSCOPY (EGD) WITH PROPOFOL N/A 09/19/2018   Procedure: ESOPHAGOGASTRODUODENOSCOPY (EGD) WITH PROPOFOL;  Surgeon: [name], MD;  Location: [facility] ENDOSCOPY;  Service: Gastroenterology;  Laterality: N/A;  . LEEP      OB History   No obstetric history on file.      Home  Medications    Prior to Admission medications   Medication Sig Start Date End Date Taking? Authorizing Provider  benzonatate (TESSALON) 100 MG capsule Take 2 capsules (200 mg total) by mouth 3 (three) times daily as needed for up to 7 days for cough. 01/14/21 01/21/21 Yes Eusebio Friendly B, PA-C  ondansetron (ZOFRAN ODT) 4 MG disintegrating tablet Take 1 tablet (4 mg total) by mouth every 8 (eight) hours as needed for nausea or vomiting. 01/14/21  Yes Shirlee Latch, PA-C  predniSONE (DELTASONE) 20 MG tablet Take 2 tablets (40 mg total) by mouth daily for 5 days. 01/14/21 01/19/21 Yes Shirlee Latch, PA-C  pantoprazole (PROTONIX) 40 MG tablet Take 1 tablet (40 mg total) by mouth daily. 09/20/18 10/30/20  Enedina Finner, MD    Family History Family History  Problem Relation Age of Onset  . Cancer Father 83       Colon cancer  . Asthma Other   . Healthy Mother     Social History Social History   Tobacco Use  . Smoking status: Never Smoker  . Smokeless tobacco: Never Used  Substance Use Topics  . Alcohol use: Not Currently  . Drug use: No     Allergies   Penicillins, Sulfasalazine, and Sulfa antibiotics   Review of Systems Review of Systems  Constitutional: Positive for chills, fatigue and fever. Negative for diaphoresis.  HENT: Positive for congestion, rhinorrhea and sore throat. Negative for ear pain, sinus pressure and sinus pain.   Respiratory:  Positive for cough. Negative for shortness of breath.   Gastrointestinal: Positive for nausea. Negative for abdominal pain, diarrhea and vomiting.  Musculoskeletal: Positive for myalgias. Negative for arthralgias.  Skin: Negative for rash.  Neurological: Positive for headaches. Negative for weakness.  Hematological: Negative for adenopathy.  Psychiatric/Behavioral: Agitation:      Physical Exam Triage Vital Signs ED Triage Vitals  Enc Vitals Group     BP 01/14/21 1707 (!) 149/85     Pulse Rate 01/14/21 1707 (!) 101     Resp  01/14/21 1707 18     Temp 01/14/21 1707 100.1 F (37.8 C)     Temp Source 01/14/21 1707 Oral     SpO2 01/14/21 1707 100 %     Weight 01/14/21 1704 220 lb 0.3 oz (99.8 kg)     Height 01/14/21 1704 5\' 1"  (1.549 m)     Head Circumference --      Peak Flow --      Pain Score 01/14/21 1704 8     Pain Loc --      Pain Edu? --      Excl. in GC? --    No data found.  Updated Vital Signs BP (!) 149/85 (BP Location: Left Arm)   Pulse (!) 101   Temp 100.1 F (37.8 C) (Oral)   Resp 18   Ht 5\' 1"  (1.549 m)   Wt 220 lb 0.3 oz (99.8 kg)   LMP 01/07/2021 (Approximate)   SpO2 100%   BMI 41.57 kg/m       Physical Exam Vitals and nursing note reviewed.  Constitutional:      General: She is not in acute distress.    Appearance: Normal appearance. She is ill-appearing. She is not toxic-appearing or diaphoretic.  HENT:     Head: Normocephalic and atraumatic.     Nose: Congestion and rhinorrhea present.     Mouth/Throat:     Mouth: Mucous membranes are moist.     Pharynx: Oropharynx is clear. Posterior oropharyngeal erythema (mild) present.  Eyes:     General: No scleral icterus.       Right eye: No discharge.        Left eye: No discharge.     Conjunctiva/sclera: Conjunctivae normal.  Cardiovascular:     Rate and Rhythm: Regular rhythm. Tachycardia present.     Heart sounds: Normal heart sounds.  Pulmonary:     Effort: Pulmonary effort is normal. No respiratory distress.     Breath sounds: Normal breath sounds. No wheezing, rhonchi or rales.  Musculoskeletal:     Cervical back: Neck supple.  Skin:    General: Skin is dry.  Neurological:     General: No focal deficit present.     Mental Status: She is alert. Mental status is at baseline.     Motor: No weakness.     Gait: Gait normal.  Psychiatric:        Mood and Affect: Mood normal.        Behavior: Behavior normal.        Thought Content: Thought content normal.      UC Treatments / Results  Labs (all labs ordered  are listed, but only abnormal results are displayed) Labs Reviewed  SARS CORONAVIRUS 2 (TAT 6-24 HRS)    EKG   Radiology No results found.  Procedures Procedures (including critical care time)  Medications Ordered in UC Medications - No data to display  Initial Impression / Assessment and Plan / UC  Course  I have reviewed the triage vital signs and the nursing notes.  Pertinent labs & imaging results that were available during my care of the patient were reviewed by me and considered in my medical decision making (see chart for details).   40 year old asthmatic female presenting for fever, fatigue, body aches, chills, sore throat, cough and congestion.  Positive COVID-19 exposure.  In the clinic vital signs are stable.  Oxygen is 100% and she has a temperature of 100 degrees.  Chest is clear to auscultation heart regular rate and rhythm.  She does have some mild nasal congestion rhinorrhea as well as posterior pharyngeal erythema and is ill-appearing on exam.  Suspect viral illness, most likely COVID-19 given her close positive exposure.  Current CDC guidelines, isolation protocol and ED precautions reviewed with patient.  Advised supportive care with increasing rest and fluids.  Sent benzonatate and Zofran to pharmacy for symptoms.  Advised starting prednisone if her asthma symptoms seem to worsen or if the inhalers are not working by themselves.  For any severe B difficulty advised to go to ED or call EMS.  If patient does also for COVID-19 advised her that I can refer her for MAB therapy since she is a candidate.  Patient says she is not sure if it something that she would do but she would like more information.  Work note provided.   Final Clinical Impressions(s) / UC Diagnoses   Final diagnoses:  Viral illness  Fever, unspecified  Cough  Nausea without vomiting     Discharge Instructions     You have received COVID testing today either for positive exposure, concerning  symptoms that could be related to COVID infection, screening purposes, or re-testing after confirmed positive.  Your test obtained today checks for active viral infection in the last 1-2 weeks. If your test is negative now, you can still test positive later. So, if you do develop symptoms you should either get re-tested and/or isolate x 5 days and then strict mask use x 5 days (unvaccinated) or mask use x 10 days (vaccinated). Please follow CDC guidelines.  While Rapid antigen tests come back in 15-20 minutes, send out PCR/molecular test results typically come back within 1-3 days. In the mean time, if you are symptomatic, assume this could be a positive test and treat/monitor yourself as if you do have COVID.   We will call with test results if positive. Please download the MyChart app and set up a profile to access test results.   If symptomatic, go home and rest. Push fluids. Take Tylenol as needed for discomfort. Gargle warm salt water. Throat lozenges. Take Mucinex DM or Robitussin for cough. Humidifier in bedroom to ease coughing. Warm showers. Also review the COVID handout for more information.  COVID-19 INFECTION: The incubation period of COVID-19 is approximately 14 days after exposure, with most symptoms developing in roughly 4-5 days. Symptoms may range in severity from mild to critically severe. Roughly 80% of those infected will have mild symptoms. People of any age may become infected with COVID-19 and have the ability to transmit the virus. The most common symptoms include: fever, fatigue, cough, body aches, headaches, sore throat, nasal congestion, shortness of breath, nausea, vomiting, diarrhea, changes in smell and/or taste.    COURSE OF ILLNESS Some patients may begin with mild disease which can progress quickly into critical symptoms. If your symptoms are worsening please call ahead to the Emergency Department and proceed there for further treatment. Recovery time appears  to be roughly  1-2 weeks for mild symptoms and 3-6 weeks for severe disease.   GO IMMEDIATELY TO ER FOR FEVER YOU ARE UNABLE TO GET DOWN WITH TYLENOL, BREATHING PROBLEMS, CHEST PAIN, FATIGUE, LETHARGY, INABILITY TO EAT OR DRINK, ETC  QUARANTINE AND ISOLATION: To help decrease the spread of COVID-19 please remain isolated if you have COVID infection or are highly suspected to have COVID infection. This means -stay home and isolate to one room in the home if you live with others. Do not share a bed or bathroom with others while ill, sanitize and wipe down all countertops and keep common areas clean and disinfected. Stay home for 5 days. If you have no symptoms or your symptoms are resolving after 5 days, you can leave your house. Continue to wear a mask around others for 5 additional days. If you have been in close contact (within 6 feet) of someone diagnosed with COVID 19, you are advised to quarantine in your home for 14 days as symptoms can develop anywhere from 2-14 days after exposure to the virus. If you develop symptoms, you  must isolate.  Most current guidelines for COVID after exposure -unvaccinated: isolate 5 days and strict mask use x 5 days. Test on day 5 is possible -vaccinated: wear mask x 10 days if symptoms do not develop -You do not necessarily need to be tested for COVID if you have + exposure and  develop symptoms. Just isolate at home x10 days from symptom onset During this global pandemic, CDC advises to practice social distancing, try to stay at least 65ft away from others at all times. Wear a face covering. Wash and sanitize your hands regularly and avoid going anywhere that is not necessary.  KEEP IN MIND THAT THE COVID TEST IS NOT 100% ACCURATE AND YOU SHOULD STILL DO EVERYTHING TO PREVENT POTENTIAL SPREAD OF VIRUS TO OTHERS (WEAR MASK, WEAR GLOVES, WASH HANDS AND SANITIZE REGULARLY). IF INITIAL TEST IS NEGATIVE, THIS MAY NOT MEAN YOU ARE DEFINITELY NEGATIVE. MOST ACCURATE TESTING IS DONE 5-7  DAYS AFTER EXPOSURE.   It is not advised by CDC to get re-tested after receiving a positive COVID test since you can still test positive for weeks to months after you have already cleared the virus.   *If you have not been vaccinated for COVID, I strongly suggest you consider getting vaccinated as long as there are no contraindications.      ED Prescriptions    Medication Sig Dispense Auth. Provider   benzonatate (TESSALON) 100 MG capsule Take 2 capsules (200 mg total) by mouth 3 (three) times daily as needed for up to 7 days for cough. 21 capsule Eusebio Friendly B, PA-C   ondansetron (ZOFRAN ODT) 4 MG disintegrating tablet Take 1 tablet (4 mg total) by mouth every 8 (eight) hours as needed for nausea or vomiting. 20 tablet Eusebio Friendly B, PA-C   predniSONE (DELTASONE) 20 MG tablet Take 2 tablets (40 mg total) by mouth daily for 5 days. 10 tablet Gareth Morgan     PDMP not reviewed this encounter.   Shirlee Latch, PA-C 01/14/21 1749

## 2021-01-14 NOTE — ED Triage Notes (Signed)
Pt c/o chills, body aches, fever, runny nose, cough. Started yesterday. Son tested positive for covid yesterday.

## 2021-01-14 NOTE — Discharge Instructions (Signed)

## 2021-01-15 ENCOUNTER — Telehealth: Payer: Self-pay | Admitting: Physician Assistant

## 2021-01-15 DIAGNOSIS — U071 COVID-19: Secondary | ICD-10-CM

## 2021-01-15 LAB — SARS CORONAVIRUS 2 (TAT 6-24 HRS): SARS Coronavirus 2: POSITIVE — AB

## 2021-01-15 NOTE — Telephone Encounter (Signed)
+   COVID test. Sent referral to infusion clinic for MAB therapy.

## 2021-01-16 ENCOUNTER — Telehealth: Payer: Self-pay | Admitting: Family

## 2021-01-16 NOTE — Telephone Encounter (Signed)
Called to discuss with patient about COVID-19 symptoms and the use of one of the available treatments for those with mild to moderate Covid symptoms and at a high risk of hospitalization.  Pt appears to qualify for outpatient treatment due to co-morbid conditions and/or a member of an at-risk group in accordance with the FDA Emergency Use Authorization.    Symptom onset:  Vaccinated: No Immunocompromised? No  Qualifiers: Asthma, BMI 41  I was unable to get in contact with Cassandra Cortez via phone as the call could not be completed. MyChart message was sent for follow up. Appears to be a candidate for Sotrovimab.   Marcos Eke, NP 01/16/2021 10:48 AM

## 2021-05-26 ENCOUNTER — Encounter: Payer: Self-pay | Admitting: Emergency Medicine

## 2021-05-26 ENCOUNTER — Ambulatory Visit (INDEPENDENT_AMBULATORY_CARE_PROVIDER_SITE_OTHER): Payer: Medicaid Other

## 2021-05-26 ENCOUNTER — Other Ambulatory Visit: Payer: Self-pay

## 2021-05-26 ENCOUNTER — Ambulatory Visit
Admission: EM | Admit: 2021-05-26 | Discharge: 2021-05-26 | Disposition: A | Discharge status: Home / Self Care | Payer: Medicaid Other | Attending: Emergency Medicine | Admitting: Emergency Medicine

## 2021-05-26 ENCOUNTER — Ambulatory Visit: Payer: Medicaid Other

## 2021-05-26 DIAGNOSIS — S60222A Contusion of left hand, initial encounter: Secondary | ICD-10-CM | POA: Diagnosis not present

## 2021-05-26 DIAGNOSIS — W19XXXA Unspecified fall, initial encounter: Secondary | ICD-10-CM

## 2021-05-26 DIAGNOSIS — M79642 Pain in left hand: Secondary | ICD-10-CM | POA: Diagnosis not present

## 2021-05-26 DIAGNOSIS — R0782 Intercostal pain: Secondary | ICD-10-CM | POA: Diagnosis not present

## 2021-05-26 DIAGNOSIS — S20223A Contusion of bilateral back wall of thorax, initial encounter: Secondary | ICD-10-CM

## 2021-05-26 MED ORDER — TRAMADOL HCL 50 MG PO TABS: 50.0000 mg | ORAL_TABLET | Freq: Three times a day (TID) | ORAL | 0 refills | Status: DC | PRN

## 2021-05-26 NOTE — ED Triage Notes (Signed)
Patient states that she was moving her son's basketball goal last night and tripped over the bicycle and fell and injured her left side of her rib cage and left lower arm and wrist.

## 2021-05-26 NOTE — Discharge Instructions (Addendum)
Your xrays today do not find any broken bones thankfully! You obviously have severe bruises from the crush/ fall.  Ice, elevation, ibuprofen every 8 hours to help with pain.  Activity as tolerated.  Tramadol as needed for breakthrough pain. May cause drowsiness. Please do not take if driving or drinking alcohol.

## 2021-05-26 NOTE — ED Provider Notes (Signed)
[facility]    CSN: [id number] Arrival date & time: 05/26/21  1239      History   Chief Complaint Chief Complaint  Patient presents with   Fall   Wrist Pain    left   Rib Pain    HPI Cassandra Cortez is a 40 y.o. female.   Cassandra Cortez presents with complaints of left hand and bilateral posterior rib pain s/p fall last night. She was moving a basketball goal last night, pulling it backward, tripped on a bike on the ground, falling back. The goal landed on her and her left hand, and she landed on the bike. Left hand is very painful, swollen and red. Pain with ROM to left hand fingers. No thumb or pinky finger pain. No previous hand injury. She is right handed. Posterior rib pain with touch and movement. Took ibuprofen today which hasn't helped with symptoms.    ROS per HPI, negative if not otherwise mentioned.     Past Medical History:  Diagnosis Date   Asthma     Patient Active Problem List   Diagnosis Date Noted   Right upper quadrant abdominal pain 09/17/2018   Seizure (HCC) 07/16/2018   Asthma exacerbation 10/28/2015   Acute respiratory distress 10/28/2015   Asthma 10/28/2015    Past Surgical History:  Procedure Laterality Date   APPENDECTOMY     CESAREAN SECTION     COLONOSCOPY WITH PROPOFOL N/A 09/19/2018   Procedure: COLONOSCOPY WITH PROPOFOL;  Surgeon: [name], MD;  Location: [facility] ENDOSCOPY;  Service: Gastroenterology;  Laterality: N/A;   ESOPHAGOGASTRODUODENOSCOPY (EGD) WITH PROPOFOL N/A 09/19/2018   Procedure: ESOPHAGOGASTRODUODENOSCOPY (EGD) WITH PROPOFOL;  Surgeon: [name], MD;  Location: [facility] ENDOSCOPY;  Service: Gastroenterology;  Laterality: N/A;   LEEP      OB History   No obstetric history on file.      Home Medications    Prior to Admission medications   Medication Sig Start Date End Date Taking? Authorizing Provider  traMADol (ULTRAM) 50 MG tablet Take 1 tablet (50 mg total) by mouth every 8 (eight) hours as needed.  05/26/21  Yes Capucine Tryon, Dorene Grebe B, NP  ondansetron (ZOFRAN ODT) 4 MG disintegrating tablet Take 1 tablet (4 mg total) by mouth every 8 (eight) hours as needed for nausea or vomiting. 01/14/21   Eusebio Friendly B, PA-C  pantoprazole (PROTONIX) 40 MG tablet Take 1 tablet (40 mg total) by mouth daily. 09/20/18 10/30/20  Enedina Finner, MD    Family History Family History  Problem Relation Age of Onset   Cancer Father 26       Colon cancer   Asthma Other    Healthy Mother     Social History Social History   Tobacco Use   Smoking status: Never   Smokeless tobacco: Never  Vaping Use   Vaping Use: Never used  Substance Use Topics   Alcohol use: Not Currently   Drug use: No     Allergies   Penicillins, Sulfasalazine, and Sulfa antibiotics   Review of Systems Review of Systems   Physical Exam Triage Vital Signs ED Triage Vitals  Enc Vitals Group     BP 05/26/21 1309 (!) 163/92     Pulse Rate 05/26/21 1309 81     Resp 05/26/21 1309 16     Temp 05/26/21 1309 98.7 F (37.1 C)     Temp Source 05/26/21 1309 Oral     SpO2 05/26/21 1309 98 %     Weight  05/26/21 1307 230 lb (104.3 kg)     Height 05/26/21 1307 5\' 1"  (1.549 m)     Head Circumference --      Peak Flow --      Pain Score 05/26/21 1306 8     Pain Loc --      Pain Edu? --      Excl. in GC? --    No data found.  Updated Vital Signs BP (!) 163/92 (BP Location: Right Arm)   Pulse 81   Temp 98.7 F (37.1 C) (Oral)   Resp 16   Ht 5\' 1"  (1.549 m)   Wt 230 lb (104.3 kg)   LMP 05/05/2021 (Approximate)   SpO2 98%   BMI 43.46 kg/m   Visual Acuity Right Eye Distance:   Left Eye Distance:   Bilateral Distance:    Right Eye Near:   Left Eye Near:    Bilateral Near:     Physical Exam Constitutional:      General: She is not in acute distress.    Appearance: She is well-developed.  Cardiovascular:     Rate and Rhythm: Normal rate.  Pulmonary:     Effort: Pulmonary effort is normal.  Musculoskeletal:     Left  wrist: Normal.     Left hand: Swelling, tenderness and bony tenderness present. Decreased range of motion. Normal capillary refill. Normal pulse.       Hands:     Thoracic back: Tenderness and bony tenderness present.       Back:     Comments: Tenderness, redness and swelling about the dorsum of left hand over pointer, middle and ring finger and metacarpal; pain with any MCP joint flexion and movement of the fingers; cap refill < 2 seconds ; bilateral posterior thoracic back/ rib tenderness with bruising; no spinous process tenderness    Skin:    General: Skin is warm and dry.  Neurological:     Mental Status: She is alert and oriented to person, place, and time.     UC Treatments / Results  Labs (all labs ordered are listed, but only abnormal results are displayed) Labs Reviewed - No data to display  EKG   Radiology DG Ribs Bilateral W/Chest  Result Date: 05/26/2021 CLINICAL DATA:  05/07/2021.  Bilateral rib pain. EXAM: BILATERAL RIBS AND CHEST - 4+ VIEW COMPARISON:  Chest x-ray 08/28/2020 FINDINGS: The cardiac silhouette, mediastinal and hilar contours are normal. The lungs are clear. No pleural effusions or pneumothorax. Dedicated views of bilateral ribs do not demonstrate any definite acute rib fractures. No pleural thickening. A IMPRESSION: No acute cardiopulmonary findings and no definite acute rib fractures. Electronically Signed   By: Larey Seat M.D.   On: 05/26/2021 14:02   DG Hand Complete Left  Result Date: 05/26/2021 CLINICAL DATA:  07/26/2021 last evening.  Left hand pain. EXAM: LEFT HAND - COMPLETE 3+ VIEW COMPARISON:  None. FINDINGS: There is moderate dorsal soft tissue swelling over the metacarpal region. The joint spaces are maintained. No acute fracture of the hand or wrist is identified. IMPRESSION: Dorsal soft tissue swelling but no acute fracture. Electronically Signed   By: 07/26/2021 M.D.   On: 05/26/2021 14:03    Procedures Procedures (including critical care  time)  Medications Ordered in UC Medications - No data to display  Initial Impression / Assessment and Plan / UC Course  I have reviewed the triage vital signs and the nursing notes.  Pertinent labs & imaging results that were  available during my care of the patient were reviewed by me and considered in my medical decision making (see chart for details).     Xrays without acute findings today. Consistent with contusions. Ice, elevation, nsaids, tramadol for breakthrough pain. Patient verbalized understanding and agreeable to plan.   Final Clinical Impressions(s) / UC Diagnoses   Final diagnoses:  Fall  Contusion of left hand, initial encounter  Contusion of both sides of back wall of thorax, initial encounter     Discharge Instructions      Your xrays today do not find any broken bones thankfully! You obviously have severe bruises from the crush/ fall.  Ice, elevation, ibuprofen every 8 hours to help with pain.  Activity as tolerated.  Tramadol as needed for breakthrough pain. May cause drowsiness. Please do not take if driving or drinking alcohol.       ED Prescriptions     Medication Sig Dispense Auth. Provider   traMADol (ULTRAM) 50 MG tablet Take 1 tablet (50 mg total) by mouth every 8 (eight) hours as needed. 10 tablet Georgetta Haber, NP      I have reviewed the PDMP during this encounter.   Georgetta Haber, NP 05/26/21 1421

## 2021-07-22 ENCOUNTER — Encounter: Payer: Self-pay | Admitting: Emergency Medicine

## 2021-07-22 ENCOUNTER — Ambulatory Visit
Admission: EM | Admit: 2021-07-22 | Discharge: 2021-07-22 | Disposition: A | Discharge status: Home / Self Care | Payer: Medicaid Other | Attending: Family Medicine | Admitting: Family Medicine

## 2021-07-22 ENCOUNTER — Other Ambulatory Visit: Payer: Self-pay

## 2021-07-22 DIAGNOSIS — S39012A Strain of muscle, fascia and tendon of lower back, initial encounter: Secondary | ICD-10-CM | POA: Diagnosis not present

## 2021-07-22 MED ORDER — PREDNISONE 10 MG PO TABS
ORAL_TABLET | ORAL | 0 refills | Status: DC
Start: 2021-07-22 — End: 2022-05-27

## 2021-07-22 MED ORDER — KETOROLAC TROMETHAMINE 60 MG/2ML IM SOLN
60.0000 mg | Freq: Once | INTRAMUSCULAR | Status: AC
  Administered 2021-07-22: 60 mg via INTRAMUSCULAR

## 2021-07-22 MED ORDER — TIZANIDINE HCL 4 MG PO TABS: 4.0000 mg | ORAL_TABLET | Freq: Three times a day (TID) | ORAL | 0 refills | Status: DC | PRN

## 2021-07-22 NOTE — ED Provider Notes (Signed)
[facility]    CSN: [id number] Arrival date & time: 07/22/21  1253      History   Chief Complaint Chief Complaint  Patient presents with   Back Pain    lower    HPI 40 year old female presents with the above complaint.  3-day history of lower thoracic/upper lumbar back pain on the right side.  No fall, trauma, or specific injury.  She has done some heavy lifting at work.  She states that her pain is severe, 8/10 in severity.  Worse with certain movements and certain positions.  She has found it difficult to sleep.  No relieving factors.  No radicular symptoms.  No saddle anesthesia or incontinence.  No other complaints.  Past Medical History:  Diagnosis Date   Asthma     Patient Active Problem List   Diagnosis Date Noted   Right upper quadrant abdominal pain 09/17/2018   Seizure (HCC) 07/16/2018   Asthma exacerbation 10/28/2015   Acute respiratory distress 10/28/2015   Asthma 10/28/2015    Past Surgical History:  Procedure Laterality Date   APPENDECTOMY     CESAREAN SECTION     COLONOSCOPY WITH PROPOFOL N/A 09/19/2018   Procedure: COLONOSCOPY WITH PROPOFOL;  Surgeon: [name], MD;  Location: [facility] ENDOSCOPY;  Service: Gastroenterology;  Laterality: N/A;   ESOPHAGOGASTRODUODENOSCOPY (EGD) WITH PROPOFOL N/A 09/19/2018   Procedure: ESOPHAGOGASTRODUODENOSCOPY (EGD) WITH PROPOFOL;  Surgeon: [name], MD;  Location: [facility] ENDOSCOPY;  Service: Gastroenterology;  Laterality: N/A;   LEEP      OB History   No obstetric history on file.      Home Medications    Prior to Admission medications   Medication Sig Start Date End Date Taking? Authorizing Provider  predniSONE (DELTASONE) 10 MG tablet 50 mg daily x 2 days, then 40 mg daily x 2 days, then 30 mg daily x 2 days, then 20 mg daily x 2 days, then 10 mg daily x 2 days. 07/22/21  Yes Shion Bluestein G, DO  tiZANidine (ZANAFLEX) 4 MG tablet Take 1 tablet (4 mg total) by mouth every 8 (eight) hours as needed for  muscle spasms. 07/22/21  Yes Race Latour G, DO  pantoprazole (PROTONIX) 40 MG tablet Take 1 tablet (40 mg total) by mouth daily. 09/20/18 10/30/20  Enedina Finner, MD    Family History Family History  Problem Relation Age of Onset   Cancer Father 84       Colon cancer   Asthma Other    Healthy Mother     Social History Social History   Tobacco Use   Smoking status: Never   Smokeless tobacco: Never  Vaping Use   Vaping Use: Never used  Substance Use Topics   Alcohol use: Not Currently   Drug use: No     Allergies   Penicillins, Sulfasalazine, and Sulfa antibiotics   Review of Systems Review of Systems Per HPI  Physical Exam Triage Vital Signs ED Triage Vitals  Enc Vitals Group     BP 07/22/21 1351 (!) 176/134     Pulse Rate 07/22/21 1351 93     Resp 07/22/21 1351 20     Temp 07/22/21 1351 98.4 F (36.9 C)     Temp Source 07/22/21 1351 Oral     SpO2 07/22/21 1351 100 %     Weight --      Height --      Head Circumference --      Peak Flow --  Pain Score 07/22/21 1347 8     Pain Loc --      Pain Edu? --      Excl. in GC? --    Updated Vital Signs BP (!) 176/134 (BP Location: Left Arm)   Pulse 93   Temp 98.4 F (36.9 C) (Oral)   Resp 20   SpO2 100%   Visual Acuity Right Eye Distance:   Left Eye Distance:   Bilateral Distance:    Right Eye Near:   Left Eye Near:    Bilateral Near:     Physical Exam Constitutional:      Appearance: She is obese. She is not ill-appearing.  HENT:     Head: Normocephalic and atraumatic.  Eyes:     General:        Right eye: No discharge.        Left eye: No discharge.     Conjunctiva/sclera: Conjunctivae normal.  Cardiovascular:     Rate and Rhythm: Normal rate and regular rhythm.  Pulmonary:     Effort: Pulmonary effort is normal.     Breath sounds: Normal breath sounds.  Musculoskeletal:       Back:     Comments: Labeled location is exquisitely tender to palpation.  Decreased range of motion secondary  to pain.  Neurological:     Mental Status: She is alert.     UC Treatments / Results  Labs (all labs ordered are listed, but only abnormal results are displayed) Labs Reviewed - No data to display  EKG   Radiology No results found.  Procedures Procedures (including critical care time)  Medications Ordered in UC Medications  ketorolac (TORADOL) injection 60 mg (60 mg Intramuscular Given 07/22/21 1430)    Initial Impression / Assessment and Plan / UC Course  I have reviewed the triage vital signs and the nursing notes.  Pertinent labs & imaging results that were available during my care of the patient were reviewed by me and considered in my medical decision making (see chart for details).    40 year old female presents with back pain.  Patient exquisitely tender on exam.  I believe that the majority of this is secondary to spasm.  Toradol given in clinic today for pain.  Sending home on Zanaflex and prednisone.  Final Clinical Impressions(s) / UC Diagnoses   Final diagnoses:  Back strain, initial encounter     Discharge Instructions      Rest, heat.  Medication as prescribed.  Gentle stretching.  Take care  Dr. Adriana Simas    ED Prescriptions     Medication Sig Dispense Auth. Provider   tiZANidine (ZANAFLEX) 4 MG tablet Take 1 tablet (4 mg total) by mouth every 8 (eight) hours as needed for muscle spasms. 30 tablet Shaeleigh Graw G, DO   predniSONE (DELTASONE) 10 MG tablet 50 mg daily x 2 days, then 40 mg daily x 2 days, then 30 mg daily x 2 days, then 20 mg daily x 2 days, then 10 mg daily x 2 days. 30 tablet Tommie Sams, DO      PDMP not reviewed this encounter.   Tommie Sams, [place] 07/22/21 1649

## 2021-07-22 NOTE — Discharge Instructions (Signed)
Rest, heat.  Medication as prescribed.  Gentle stretching.  Take care  Dr. Adriana Simas

## 2021-07-22 NOTE — ED Triage Notes (Addendum)
Pt presents today with c/o of mid to lower back pain x 3 days. She denies specific injury, but does report heavy lifting at work.

## 2021-07-27 ENCOUNTER — Emergency Department: Payer: Medicaid Other

## 2021-07-27 ENCOUNTER — Other Ambulatory Visit: Payer: Self-pay

## 2021-07-27 ENCOUNTER — Emergency Department
Admission: EM | Admit: 2021-07-27 | Discharge: 2021-07-27 | Disposition: A | Discharge status: Home / Self Care | Payer: Medicaid Other | Attending: Emergency Medicine | Admitting: Emergency Medicine

## 2021-07-27 DIAGNOSIS — J45909 Unspecified asthma, uncomplicated: Secondary | ICD-10-CM | POA: Diagnosis not present

## 2021-07-27 DIAGNOSIS — M545 Low back pain, unspecified: Secondary | ICD-10-CM | POA: Insufficient documentation

## 2021-07-27 DIAGNOSIS — R109 Unspecified abdominal pain: Secondary | ICD-10-CM | POA: Insufficient documentation

## 2021-07-27 LAB — URINALYSIS, COMPLETE (UACMP) WITH MICROSCOPIC
Bilirubin Urine: NEGATIVE
Glucose, UA: NEGATIVE mg/dL
Ketones, ur: NEGATIVE mg/dL
Nitrite: NEGATIVE
Protein, ur: 30 mg/dL — AB
Specific Gravity, Urine: 1.035 — ABNORMAL HIGH (ref 1.005–1.030)
pH: 5 (ref 5.0–8.0)

## 2021-07-27 LAB — POC URINE PREG, ED: Preg Test, Ur: NEGATIVE

## 2021-07-27 MED ORDER — ORPHENADRINE CITRATE ER 100 MG PO TB12: 100.0000 mg | ORAL_TABLET | Freq: Two times a day (BID) | ORAL | 0 refills | Status: DC

## 2021-07-27 MED ORDER — HYDROMORPHONE HCL 1 MG/ML IJ SOLN
1.0000 mg | Freq: Once | INTRAMUSCULAR | Status: AC
Start: 2021-07-27 — End: 2021-07-27
  Administered 2021-07-27: 1 mg via INTRAMUSCULAR
  Filled 2021-07-27: qty 1

## 2021-07-27 MED ORDER — OXYCODONE-ACETAMINOPHEN 7.5-325 MG PO TABS: 1.0000 | ORAL_TABLET | Freq: Four times a day (QID) | ORAL | 0 refills | Status: DC | PRN

## 2021-07-27 MED ORDER — ORPHENADRINE CITRATE 30 MG/ML IJ SOLN
60.0000 mg | Freq: Two times a day (BID) | INTRAMUSCULAR | Status: DC
  Administered 2021-07-27: 60 mg via INTRAMUSCULAR
  Filled 2021-07-27: qty 2

## 2021-07-27 NOTE — Discharge Instructions (Addendum)
Read and follow discharge care instructions.  No acute findings on x-ray, UA, and CT scan.  Be advised pain medication may cause drowsiness.

## 2021-07-27 NOTE — ED Triage Notes (Signed)
Pt comes pov with lower back pain after injury last week. Was seen at [facility] and given pain meds and prednisone but hasn't gotten any better. Does not want to do worker's comp.

## 2021-07-27 NOTE — ED Provider Notes (Signed)
[facility] Emergency Department Provider Note   ____________________________________________   Event Date/Time   First MD Initiated Contact with Patient 07/27/21 [phone]     (approximate)  I have reviewed the triage vital signs and the nursing notes.   HISTORY  Chief Complaint Back Pain    HPI Cassandra Cortez is a 40 y.o. female patient complain of mid/low back and flank pain for 1 week.  Patient onset of complaint after lifting incident.  Patient was seen at urgent care clinic and given Zanaflex and prednisone.  Patient said no change in complaint.  Patient denies radicular component to back pain.  Patient denies bladder or bowel dysfunction.  Rates pain as 8/10.  Described pain as "aching".         Past Medical History:  Diagnosis Date   Asthma     Patient Active Problem List   Diagnosis Date Noted   Right upper quadrant abdominal pain 09/17/2018   Seizure (HCC) 07/16/2018   Asthma exacerbation 10/28/2015   Acute respiratory distress 10/28/2015   Asthma 10/28/2015    Past Surgical History:  Procedure Laterality Date   APPENDECTOMY     CESAREAN SECTION     COLONOSCOPY WITH PROPOFOL N/A 09/19/2018   Procedure: COLONOSCOPY WITH PROPOFOL;  Surgeon: [name], MD;  Location: [facility] ENDOSCOPY;  Service: Gastroenterology;  Laterality: N/A;   ESOPHAGOGASTRODUODENOSCOPY (EGD) WITH PROPOFOL N/A 09/19/2018   Procedure: ESOPHAGOGASTRODUODENOSCOPY (EGD) WITH PROPOFOL;  Surgeon: [name], MD;  Location: [facility] ENDOSCOPY;  Service: Gastroenterology;  Laterality: N/A;   LEEP      Prior to Admission medications   Medication Sig Start Date End Date Taking? Authorizing Provider  orphenadrine (NORFLEX) 100 MG tablet Take 1 tablet (100 mg total) by mouth 2 (two) times daily. 07/27/21  Yes Joni Reining, PA-C  oxyCODONE-acetaminophen (PERCOCET) 7.5-325 MG tablet Take 1 tablet by mouth every 6 (six) hours as needed for severe pain. 07/27/21  Yes Joni Reining,  PA-C  predniSONE (DELTASONE) 10 MG tablet 50 mg daily x 2 days, then 40 mg daily x 2 days, then 30 mg daily x 2 days, then 20 mg daily x 2 days, then 10 mg daily x 2 days. 07/22/21   Tommie Sams, DO  tiZANidine (ZANAFLEX) 4 MG tablet Take 1 tablet (4 mg total) by mouth every 8 (eight) hours as needed for muscle spasms. 07/22/21   Tommie Sams, DO  pantoprazole (PROTONIX) 40 MG tablet Take 1 tablet (40 mg total) by mouth daily. 09/20/18 10/30/20  Enedina Finner, MD    Allergies Penicillins, Sulfasalazine, and Sulfa antibiotics  Family History  Problem Relation Age of Onset   Cancer Father 7       Colon cancer   Asthma Other    Healthy Mother     Social History Social History   Tobacco Use   Smoking status: Never   Smokeless tobacco: Never  Vaping Use   Vaping Use: Never used  Substance Use Topics   Alcohol use: Not Currently   Drug use: No    Review of Systems  Constitutional: No fever/chills Eyes: No visual changes. ENT: No sore throat. Cardiovascular: Denies chest pain. Respiratory: Denies shortness of breath. Gastrointestinal: No abdominal pain.  No nausea, no vomiting.  No diarrhea.  No constipation. Genitourinary: Negative for dysuria. Musculoskeletal: Positive for back pain. Skin: Negative for rash. Neurological: Negative for headaches, focal weakness or numbness. Allergic/Immunilogical: Penicillin and sulfa 4. ____________________________________________   PHYSICAL EXAM:  VITAL SIGNS:  ED Triage Vitals  Enc Vitals Group     BP 07/27/21 0923 (!) 144/101     Pulse Rate 07/27/21 0923 (!) 105     Resp 07/27/21 0923 18     Temp 07/27/21 0923 99.2 F (37.3 C)     Temp Source 07/27/21 0923 Oral     SpO2 07/27/21 0923 97 %     Weight 07/27/21 0924 240 lb (108.9 kg)     Height 07/27/21 0924 5\' 1"  (1.549 m)     Head Circumference --      Peak Flow --      Pain Score 07/27/21 0924 8     Pain Loc --      Pain Edu? --      Excl. in GC? --    Constitutional:  Alert and oriented. Well appearing and in no acute distress. Eyes: Conjunctivae are normal. PERRL. EOMI. Head: Atraumatic. Nose: No congestion/rhinnorhea. Mouth/Throat: Mucous membranes are moist.  Oropharynx non-erythematous. Neck: No stridor.  No cervical spine tenderness to palpation. Hematological/Lymphatic/Immunilogical: No cervical lymphadenopathy. Cardiovascular: Normal rate, regular rhythm. Grossly normal heart sounds.  Good peripheral circulation. Respiratory: Normal respiratory effort.  No retractions. Lungs CTAB. Gastrointestinal: Soft and nontender. No distention. No abdominal bruits. No CVA tenderness. Genitourinary: Deferred Musculoskeletal: No lower extremity tenderness nor edema.  No joint effusions. Neurologic:  Normal speech and language. No gross focal neurologic deficits are appreciated. No gait instability. Skin:  Skin is warm, dry and intact. No rash noted. Psychiatric: Mood and affect are normal. Speech and behavior are normal.  ____________________________________________   LABS (all labs ordered are listed, but only abnormal results are displayed)  Labs Reviewed  URINALYSIS, COMPLETE (UACMP) WITH MICROSCOPIC - Abnormal; Notable for the following components:      Result Value   Color, Urine YELLOW (*)    APPearance CLOUDY (*)    Specific Gravity, Urine 1.035 (*)    Hgb urine dipstick MODERATE (*)    Protein, ur 30 (*)    Leukocytes,Ua TRACE (*)    Bacteria, UA FEW (*)    All other components within normal limits  URINE CULTURE  POC URINE PREG, ED   ____________________________________________  EKG   ____________________________________________  RADIOLOGY I, 07/29/21, personally viewed and evaluated these images (plain radiographs) as part of my medical decision making, as well as reviewing the written report by the radiologist.  ED MD interpretation: No acute findings on x-ray of the lumbar spine.  Official radiology report(s): DG Lumbar  Spine 2-3 Views  Result Date: 07/27/2021 CLINICAL DATA:  Mid back and flank pain for 1 week. EXAM: LUMBAR SPINE - 2-3 VIEW COMPARISON:  None. FINDINGS: There is no evidence of lumbar spine fracture. Alignment is normal. Intervertebral disc spaces are maintained. No radiopaque calculi overlie the urinary tract. IMPRESSION: No findings to explain the patient's symptoms. Electronically Signed   By: 07/29/2021 M.D.   On: 07/27/2021 12:33   CT Renal Stone Study  Result Date: 07/27/2021 CLINICAL DATA:  Flank pain, concern for kidney stone. EXAM: CT ABDOMEN AND PELVIS WITHOUT CONTRAST TECHNIQUE: Multidetector CT imaging of the abdomen and pelvis was performed following the standard protocol without IV contrast. COMPARISON:  CT abdomen pelvis dated 07/17/2018. FINDINGS: Lower chest: No acute abnormality. Hepatobiliary: No focal liver abnormality is seen. The liver is hypoattenuating, consistent with hepatic steatosis. No gallstones, gallbladder wall thickening, or biliary dilatation. Pancreas: Unremarkable. No pancreatic ductal dilatation or surrounding inflammatory changes. Spleen: Normal in size without focal abnormality.  Adrenals/Urinary Tract: Adrenal glands are unremarkable. Kidneys are normal, without renal calculi, focal lesion, or hydronephrosis. Bladder is unremarkable. Stomach/Bowel: Stomach is within normal limits. No evidence of bowel wall thickening, distention, or inflammatory changes. Vascular/Lymphatic: No significant vascular findings are present. No enlarged abdominal or pelvic lymph nodes. Reproductive: Uterus and bilateral adnexa are unremarkable. Other: No abdominal wall hernia or abnormality. No abdominopelvic ascites. Musculoskeletal: No acute or significant osseous findings. IMPRESSION: 1.  No acute findings to explain the patient's symptoms. 2.  Hepatic steatosis. Electronically Signed   By: Romona Curls M.D.   On: 07/27/2021 14:07     ____________________________________________   PROCEDURES  Procedure(s) performed (including Critical Care):  Procedures   ____________________________________________   INITIAL IMPRESSION / ASSESSMENT AND PLAN / ED COURSE  As part of my medical decision making, I reviewed the following data within the electronic MEDICAL RECORD NUMBER       Patient presents with low back pain for 1 week.  Patient states no relief after seen in urgent care clinic and prescribed Zanaflex and prednisone.  Patient states pain is in the right flank area and radiates to the lateral abdomen.  Differentials consist of UTI, lumbar strain, or renal calculus.  Discussed no acute findings on urinalysis although culture is pending.  Also discussed no acute findings on CT and x-rays.  Patient given discharge care instructions and advised to follow-up with PCP if no improvement in 3 days.  Return to ED if condition worsens.       ____________________________________________   FINAL CLINICAL IMPRESSION(S) / ED DIAGNOSES  Final diagnoses:  Acute right flank pain     ED Discharge Orders          Ordered    oxyCODONE-acetaminophen (PERCOCET) 7.5-325 MG tablet  Every 6 hours PRN        07/27/21 1415    orphenadrine (NORFLEX) 100 MG tablet  2 times daily        07/27/21 1415             Note:  This document was prepared using Dragon voice recognition software and may include unintentional dictation errors.    Joni Reining, PA-C 07/27/21 1418    Delton [place], MD 07/27/21 1539

## 2021-07-29 LAB — URINE CULTURE: Culture: >=100000 — AB

## 2021-09-27 ENCOUNTER — Ambulatory Visit
Admission: EM | Admit: 2021-09-27 | Discharge: 2021-09-27 | Disposition: A | Discharge status: Home / Self Care | Payer: Medicaid Other | Attending: Physician Assistant | Admitting: Physician Assistant

## 2021-09-27 ENCOUNTER — Encounter: Payer: Self-pay | Admitting: Emergency Medicine

## 2021-09-27 ENCOUNTER — Other Ambulatory Visit: Payer: Self-pay

## 2021-09-27 DIAGNOSIS — B001 Herpesviral vesicular dermatitis: Secondary | ICD-10-CM | POA: Diagnosis present

## 2021-09-27 DIAGNOSIS — B349 Viral infection, unspecified: Secondary | ICD-10-CM | POA: Diagnosis not present

## 2021-09-27 DIAGNOSIS — Z880 Allergy status to penicillin: Secondary | ICD-10-CM | POA: Diagnosis not present

## 2021-09-27 DIAGNOSIS — Z20822 Contact with and (suspected) exposure to covid-19: Secondary | ICD-10-CM | POA: Diagnosis not present

## 2021-09-27 DIAGNOSIS — R051 Acute cough: Secondary | ICD-10-CM | POA: Insufficient documentation

## 2021-09-27 DIAGNOSIS — Z79899 Other long term (current) drug therapy: Secondary | ICD-10-CM | POA: Diagnosis not present

## 2021-09-27 DIAGNOSIS — Z882 Allergy status to sulfonamides status: Secondary | ICD-10-CM | POA: Insufficient documentation

## 2021-09-27 DIAGNOSIS — J029 Acute pharyngitis, unspecified: Secondary | ICD-10-CM | POA: Diagnosis not present

## 2021-09-27 DIAGNOSIS — L400 Psoriasis vulgaris: Principal | ICD-10-CM

## 2021-09-27 LAB — RESP PANEL BY RT-PCR (FLU A&B, COVID) ARPGX2
Influenza A by PCR: NEGATIVE
Influenza B by PCR: NEGATIVE
SARS Coronavirus 2 by RT PCR: NEGATIVE

## 2021-09-27 MED ORDER — HUMIRA PEN CITRATE FREE STARTER PACK FOR PS/UV 1X 80 MG/0.8 ML, 2X 40 MG/0.4 ML
PACK | 0 refills | 0 days
Start: 2021-09-27 — End: ?

## 2021-09-27 MED ORDER — VALACYCLOVIR HCL 1 G PO TABS: 2000.0000 mg | ORAL_TABLET | Freq: Two times a day (BID) | ORAL | 0 refills | Status: AC

## 2021-09-27 NOTE — ED Triage Notes (Signed)
Reports headache, cough, congestion for 2 days

## 2021-09-27 NOTE — Discharge Instructions (Signed)
-  We have tested you for COVID-19 and influenza.  The result will be back in 45 minutes to an hour.  You have decided to go home and wait for the result.  I will call you with the results when I get them. -If you have the flu I can send Tamiflu.  If you have COVID-19 there are antivirals I can send for that as well.  If you have COVID-19 you to be isolated for 5 days and then wear a mask for 5 days. -For viral illnesses, care is largely supportive.  Cough syrups, ibuprofen and Tylenol for discomfort and fevers.  Increasing rest and fluids.  Most viruses run their course in 1 to 2 weeks. -You should go to the emergency department if you have any uncontrollable fever, severe or worsening cough, chest pain or breathing difficulty. -Use at home breathing treatments if needed. -I have sent Valtrex for your cold sore. -Follow-up with [place] as needed.

## 2021-09-27 NOTE — ED Provider Notes (Signed)
[facility]    CSN: [id number] Arrival date & time: 09/27/21  1345      History   Chief Complaint Chief Complaint  Patient presents with   Cough    HPI Cassandra Cortez is a 40 y.o. female presenting for 2-day history of fever up to 102 degrees, fatigue, body aches, cough, congestion, bilateral ear pain, headaches and sore throat.  Her son is ill with similar symptoms but has not had any fevers.  She has a history of asthma but denies any shortness of breath at this time.  She has been taking over-the-counter Robitussin and trying to stay hydrated.  No known COVID exposure and she admits to a history of COVID-19 earlier this year.  Patient does report sores that have developed in the corners of her mouth since yesterday.  No other complaints today.  HPI  Past Medical History:  Diagnosis Date   Asthma     Patient Active Problem List   Diagnosis Date Noted   Right upper quadrant abdominal pain 09/17/2018   Seizure (HCC) 07/16/2018   Asthma exacerbation 10/28/2015   Acute respiratory distress 10/28/2015   Asthma 10/28/2015    Past Surgical History:  Procedure Laterality Date   APPENDECTOMY     CESAREAN SECTION     COLONOSCOPY WITH PROPOFOL N/A 09/19/2018   Procedure: COLONOSCOPY WITH PROPOFOL;  Surgeon: [name], MD;  Location: [facility] ENDOSCOPY;  Service: Gastroenterology;  Laterality: N/A;   ESOPHAGOGASTRODUODENOSCOPY (EGD) WITH PROPOFOL N/A 09/19/2018   Procedure: ESOPHAGOGASTRODUODENOSCOPY (EGD) WITH PROPOFOL;  Surgeon: [name], MD;  Location: [facility] ENDOSCOPY;  Service: Gastroenterology;  Laterality: N/A;   LEEP      OB History   No obstetric history on file.      Home Medications    Prior to Admission medications   Medication Sig Start Date End Date Taking? Authorizing Provider  valACYclovir (VALTREX) 1000 MG tablet Take 2 tablets (2,000 mg total) by mouth 2 (two) times daily for 1 day. 09/27/21 09/28/21 Yes Shirlee Latch, PA-C  orphenadrine  (NORFLEX) 100 MG tablet Take 1 tablet (100 mg total) by mouth 2 (two) times daily. 07/27/21   Joni Reining, PA-C  oxyCODONE-acetaminophen (PERCOCET) 7.5-325 MG tablet Take 1 tablet by mouth every 6 (six) hours as needed for severe pain. 07/27/21   Joni Reining, PA-C  predniSONE (DELTASONE) 10 MG tablet 50 mg daily x 2 days, then 40 mg daily x 2 days, then 30 mg daily x 2 days, then 20 mg daily x 2 days, then 10 mg daily x 2 days. 07/22/21   Tommie Sams, DO  tiZANidine (ZANAFLEX) 4 MG tablet Take 1 tablet (4 mg total) by mouth every 8 (eight) hours as needed for muscle spasms. 07/22/21   Tommie Sams, DO  pantoprazole (PROTONIX) 40 MG tablet Take 1 tablet (40 mg total) by mouth daily. 09/20/18 10/30/20  Enedina Finner, MD    Family History Family History  Problem Relation Age of Onset   Cancer Father 30       Colon cancer   Asthma Other    Healthy Mother     Social History Social History   Tobacco Use   Smoking status: Never   Smokeless tobacco: Never  Vaping Use   Vaping Use: Never used  Substance Use Topics   Alcohol use: Not Currently   Drug use: No     Allergies   Penicillins, Sulfasalazine, and Sulfa antibiotics   Review of Systems Review of  Systems  Constitutional:  Positive for fatigue and fever. Negative for chills and diaphoresis.  HENT:  Positive for congestion, ear pain, rhinorrhea, sinus pressure and sore throat.   Respiratory:  Positive for cough. Negative for shortness of breath and wheezing.   Cardiovascular:  Negative for chest pain.  Gastrointestinal:  Negative for abdominal pain, nausea and vomiting.  Musculoskeletal:  Positive for myalgias. Negative for arthralgias.  Skin:  Negative for rash.  Neurological:  Positive for headaches. Negative for weakness.  Hematological:  Negative for adenopathy.    Physical Exam Triage Vital Signs ED Triage Vitals  Enc Vitals Group     BP --      Pulse Rate 09/27/21 1419 83     Resp 09/27/21 1419 16     Temp  09/27/21 1419 98.4 F (36.9 C)     Temp Source 09/27/21 1419 Oral     SpO2 09/27/21 1419 99 %     Weight --      Height --      Head Circumference --      Peak Flow --      Pain Score 09/27/21 1418 6     Pain Loc --      Pain Edu? --      Excl. in [place]? --    No data found.  Updated Vital Signs Pulse 83   Temp 98.4 F (36.9 C) (Oral)   Resp 16   LMP 09/21/2021   SpO2 99%     Physical Exam Vitals and nursing note reviewed.  Constitutional:      General: She is not in acute distress.    Appearance: Normal appearance. She is ill-appearing. She is not toxic-appearing.  HENT:     Head: Normocephalic and atraumatic.     Right Ear: A middle ear effusion is present. Tympanic membrane is injected.     Left Ear: A middle ear effusion is present. Tympanic membrane is injected.     Nose: Congestion and rhinorrhea present.     Mouth/Throat:     Mouth: Mucous membranes are moist.     Pharynx: Oropharynx is clear. Posterior oropharyngeal erythema (mild) present.     Comments: Cold sores of bilateral angles of mouth Eyes:     General: No scleral icterus.       Right eye: No discharge.        Left eye: No discharge.     Conjunctiva/sclera: Conjunctivae normal.  Cardiovascular:     Rate and Rhythm: Normal rate and regular rhythm.     Heart sounds: Normal heart sounds.  Pulmonary:     Effort: Pulmonary effort is normal. No respiratory distress.     Breath sounds: Normal breath sounds.  Musculoskeletal:     Cervical back: Neck supple.  Skin:    General: Skin is dry.  Neurological:     General: No focal deficit present.     Mental Status: She is alert. Mental status is at baseline.     Motor: No weakness.     Gait: Gait normal.  Psychiatric:        Mood and Affect: Mood normal.        Behavior: Behavior normal.        Thought Content: Thought content normal.     UC Treatments / Results  Labs (all labs ordered are listed, but only abnormal results are displayed) Labs  Reviewed  RESP PANEL BY RT-PCR (FLU A&B, COVID) ARPGX2    EKG   Radiology No  results found.  Procedures Procedures (including critical care time)  Medications Ordered in UC Medications - No data to display  Initial Impression / Assessment and Plan / UC Course  I have reviewed the triage vital signs and the nursing notes.  Pertinent labs & imaging results that were available during my care of the patient were reviewed by me and considered in my medical decision making (see chart for details).  40 year old female presenting for 2-day history of fever, fatigue, achiness, cough, congestion and sore throat.  Has history of asthma.  Patient currently afebrile.  She is ill-appearing but nontoxic.  Exam significant for mild effusion and injection of bilateral TMs, nasal congestion, mild posterior pharyngeal erythema.  She does have cold sores in the corners of her mouth.  Chest is clear to auscultation heart regular rate and rhythm.  Respiratory panel obtained.  Patient did not want to stay and wait for result.  Advised her that I will contact her with results.  If influenza positive, will send Tamiflu.  If COVID-positive we will send Molnupiravir.  If COVID-positive, reviewed current CDC guidelines, isolation protocol and ED precautions.   I have sent Valtrex for the cold sores.  Reviewed continuing cough syrup and increasing rest and fluids as well as Tylenol and Motrin as needed for fever.  Work note given for today.  All negative respiratory panel. Suspect other viral illness.   Final Clinical Impressions(s) / UC Diagnoses   Final diagnoses:  Viral illness  Acute cough  Cold sore  Sore throat     Discharge Instructions      -We have tested you for COVID-19 and influenza.  The result will be back in 45 minutes to an hour.  You have decided to go home and wait for the result.  I will call you with the results when I get them. -If you have the flu I can send Tamiflu.  If you  have COVID-19 there are antivirals I can send for that as well.  If you have COVID-19 you to be isolated for 5 days and then wear a mask for 5 days. -For viral illnesses, care is largely supportive.  Cough syrups, ibuprofen and Tylenol for discomfort and fevers.  Increasing rest and fluids.  Most viruses run their course in 1 to 2 weeks. -You should go to the emergency department if you have any uncontrollable fever, severe or worsening cough, chest pain or breathing difficulty. -Use at home breathing treatments if needed. -I have sent Valtrex for your cold sore. -Follow-up with [place] as needed.     ED Prescriptions     Medication Sig Dispense Auth. Provider   valACYclovir (VALTREX) 1000 MG tablet Take 2 tablets (2,000 mg total) by mouth 2 (two) times daily for 1 day. 4 tablet Gareth Morgan      PDMP not reviewed this encounter.   Shirlee Latch, PA-C 09/27/21 1555

## 2021-09-27 NOTE — Unmapped (Signed)
Refill request for HUMIRA PEN CITRATE FREE STARTER PACK FOR PS/UV 1X 80 MG/0.8 ML, 2X 40 MG/0.4 ML.    LOV: 02/01/2019    Pt needs appointment for refills.

## 2021-09-27 NOTE — Unmapped (Signed)
Called, no answer.

## 2022-02-02 ENCOUNTER — Encounter: Payer: Self-pay | Admitting: Emergency Medicine

## 2022-02-02 ENCOUNTER — Emergency Department: Payer: Medicaid Other

## 2022-02-02 ENCOUNTER — Emergency Department
Admission: EM | Admit: 2022-02-02 | Discharge: 2022-02-02 | Disposition: A | Discharge status: Home / Self Care | Payer: Medicaid Other | Attending: Emergency Medicine | Admitting: Emergency Medicine

## 2022-02-02 ENCOUNTER — Other Ambulatory Visit: Payer: Self-pay

## 2022-02-02 DIAGNOSIS — R079 Chest pain, unspecified: Secondary | ICD-10-CM | POA: Diagnosis not present

## 2022-02-02 DIAGNOSIS — R0602 Shortness of breath: Secondary | ICD-10-CM | POA: Insufficient documentation

## 2022-02-02 DIAGNOSIS — J45901 Unspecified asthma with (acute) exacerbation: Secondary | ICD-10-CM | POA: Insufficient documentation

## 2022-02-02 DIAGNOSIS — Z20822 Contact with and (suspected) exposure to covid-19: Secondary | ICD-10-CM | POA: Insufficient documentation

## 2022-02-02 DIAGNOSIS — D72829 Elevated white blood cell count, unspecified: Secondary | ICD-10-CM | POA: Diagnosis not present

## 2022-02-02 DIAGNOSIS — I10 Essential (primary) hypertension: Secondary | ICD-10-CM | POA: Diagnosis not present

## 2022-02-02 LAB — BASIC METABOLIC PANEL
Anion gap: 12 (ref 5–15)
BUN: 11 mg/dL (ref 6–20)
CO2: 20 mmol/L — ABNORMAL LOW (ref 22–32)
Calcium: 9.1 mg/dL (ref 8.9–10.3)
Chloride: 104 mmol/L (ref 98–111)
Creatinine, Ser: 0.69 mg/dL (ref 0.44–1.00)
GFR, Estimated: >60 mL/min (ref >60)
Glucose, Bld: 139 mg/dL — ABNORMAL HIGH (ref 70–99)
Potassium: 4.2 mmol/L (ref 3.5–5.1)
Sodium: 136 mmol/L (ref 135–145)

## 2022-02-02 LAB — TROPONIN I (HIGH SENSITIVITY)
Troponin I (High Sensitivity): 6 ng/L (ref <18)
Troponin I (High Sensitivity): 5 ng/L (ref <18)

## 2022-02-02 LAB — CBC
HCT: 46.2 % — ABNORMAL HIGH (ref 36.0–46.0)
Hemoglobin: 14.9 g/dL (ref 12.0–15.0)
MCH: 30.2 pg (ref 26.0–34.0)
MCHC: 32.3 g/dL (ref 30.0–36.0)
MCV: 93.7 fL (ref 80.0–100.0)
Platelets: 446 10*3/uL — ABNORMAL HIGH (ref 150–400)
RBC: 4.93 MIL/uL (ref 3.87–5.11)
RDW: 12.9 % (ref 11.5–15.5)
WBC: 14.6 10*3/uL — ABNORMAL HIGH (ref 4.0–10.5)
nRBC: 0 % (ref 0.0–0.2)

## 2022-02-02 LAB — PROTIME-INR
INR: 1 (ref 0.8–1.2)
Prothrombin Time: 12.7 seconds (ref 11.4–15.2)

## 2022-02-02 LAB — RESP PANEL BY RT-PCR (FLU A&B, COVID) ARPGX2
Influenza A by PCR: NEGATIVE
Influenza B by PCR: NEGATIVE
SARS Coronavirus 2 by RT PCR: NEGATIVE

## 2022-02-02 LAB — APTT: aPTT: 34 seconds (ref 24–36)

## 2022-02-02 LAB — PROCALCITONIN: Procalcitonin: <0.1 ng/mL

## 2022-02-02 LAB — LACTIC ACID, PLASMA: Lactic Acid, Venous: 1.8 mmol/L (ref 0.5–1.9)

## 2022-02-02 MED ORDER — ALBUTEROL SULFATE HFA 108 (90 BASE) MCG/ACT IN AERS: 2.0000 | INHALATION_SPRAY | Freq: Four times a day (QID) | RESPIRATORY_TRACT | 2 refills | Status: AC | PRN

## 2022-02-02 MED ORDER — LACTATED RINGERS IV BOLUS
1000.0000 mL | Freq: Once | INTRAVENOUS | Status: AC
  Administered 2022-02-02: 1000 mL via INTRAVENOUS

## 2022-02-02 MED ORDER — IOHEXOL 350 MG/ML SOLN
75.0000 mL | Freq: Once | INTRAVENOUS | Status: AC | PRN
  Administered 2022-02-02: 75 mL via INTRAVENOUS

## 2022-02-02 MED ORDER — KETOROLAC TROMETHAMINE 30 MG/ML IJ SOLN
15.0000 mg | Freq: Once | INTRAMUSCULAR | Status: AC
  Administered 2022-02-02: 15 mg via INTRAVENOUS
  Filled 2022-02-02: qty 1

## 2022-02-02 MED ORDER — MORPHINE SULFATE (PF) 4 MG/ML IV SOLN
6.0000 mg | Freq: Once | INTRAVENOUS | Status: AC
  Administered 2022-02-02: 6 mg via INTRAVENOUS
  Filled 2022-02-02: qty 2

## 2022-02-02 MED ORDER — ACETAMINOPHEN 500 MG PO TABS
1000.0000 mg | ORAL_TABLET | Freq: Once | ORAL | Status: AC
  Administered 2022-02-02: 1000 mg via ORAL
  Filled 2022-02-02: qty 2

## 2022-02-02 NOTE — ED Triage Notes (Signed)
Pt to ED via POV with c/o L upper chest pain that radiates to mid upper back. Pt to ED ambulatory clutching L breast and L arm, pt tearful on arrival and hyperventilating on arrival. Pt states initially thought it was an asthma attack, took a neb treatment with  no relief and worsening of pain.

## 2022-02-02 NOTE — ED Provider Notes (Addendum)
[facility] Provider Note    Event Date/Time   First MD Initiated Contact with Patient 02/02/22 [phone]     (approximate)   History   Chest Pain   HPI  Cassandra Cortez is a 41 y.o. female with a past medical history of asthma and obesity who presents for evaluation of fairly sudden onset of some shortness of breath associated with chest pain radiating to the mid back started around 4 AM this morning.  Patient states she gave herself a nebulizer treatment does not help much and she does not feel like this is typical of her usual asthma exacerbations.  She felt like she was in her usual state health last night.  She denies any associated cough, fevers, headache, earache, sore throat, vomiting, diarrhea, rash or any other acute sick symptoms.      Physical Exam  Triage Vital Signs: ED Triage Vitals  Enc Vitals Group     BP 02/02/22 0629 (!) 154/121     Pulse Rate 02/02/22 0629 (!) 102     Resp 02/02/22 0629 (!) 28     Temp 02/02/22 0629 98.6 F (37 C)     Temp Source 02/02/22 0629 Oral     SpO2 02/02/22 0629 98 %     Weight 02/02/22 0622 244 lb (110.7 kg)     Height 02/02/22 0622 5\' 1"  (1.549 m)     Head Circumference --      Peak Flow --      Pain Score 02/02/22 0634 10     Pain Loc --      Pain Edu? --      Excl. in [place]? --     Most recent vital signs: Vitals:   02/02/22 0816 02/02/22 0903  BP: (!) 163/109 (!) 168/89  Pulse: 77 86  Resp: 16 13  Temp:    SpO2: 97% 100%    General: Awake, appears very uncomfortable. CV:  Good peripheral perfusion.  No significant murmur.  Tachycardic. Resp:  Slight tachypnea but clear bilaterally. Abd:  Soft throughout. Other:  Chest pain is not clearly reproducible and there is no anterior chest wall rash.   ED Results / Procedures / Treatments  Labs (all labs ordered are listed, but only abnormal results are displayed) Labs Reviewed  BASIC METABOLIC PANEL - Abnormal; Notable for the following  components:      Result Value   CO2 20 (*)    Glucose, Bld 139 (*)    All other components within normal limits  CBC - Abnormal; Notable for the following components:   WBC 14.6 (*)    HCT 46.2 (*)    Platelets 446 (*)    All other components within normal limits  RESP PANEL BY RT-PCR (FLU A&B, COVID) ARPGX2  CULTURE, BLOOD (ROUTINE X 2)  CULTURE, BLOOD (ROUTINE X 2)  PROCALCITONIN  LACTIC ACID, PLASMA  PROTIME-INR  APTT  LACTIC ACID, PLASMA  POC URINE PREG, ED  TROPONIN I (HIGH SENSITIVITY)  TROPONIN I (HIGH SENSITIVITY)     EKG  ECG is markable sinus tachycardia with ventricular rate of 116, normal axis, unremarkable intervals without evidence of acute ischemia or significant arrhythmia.   RADIOLOGY Chest x-ray reviewed by myself shows no focal consoidation, effusion, edema, pneumothorax or other clear acute thoracic process. I also reviewed radiology interpretation and agree with findings described.  CTA chest on my interpretation shows no large PE, evidence of dissection, pneumonia, pneumothorax, effusion or other clear acute thoracic  process.  I also reviewed radiology interpretation and agree with their findings of no acute thoracic process.   PROCEDURES:  Critical Care performed: No  .1-3 Lead EKG Interpretation Performed by: Lucrezia Starch, MD Authorized by: Lucrezia Starch, MD     Interpretation: normal     ECG rate assessment: normal     Rhythm: sinus rhythm     Ectopy: none     Conduction: normal    The patient is on the cardiac monitor to evaluate for evidence of arrhythmia and/or significant heart rate changes.   MEDICATIONS ORDERED IN ED: Medications  lactated ringers bolus 1,000 mL (1,000 mLs Intravenous New Bag/Given 02/02/22 [id number])  ketorolac (TORADOL) 30 MG/ML injection 15 mg (15 mg Intravenous Given 02/02/22 0813)  acetaminophen (TYLENOL) tablet 1,000 mg (1,000 mg Oral Given 02/02/22 0819)  iohexol (OMNIPAQUE) 350 MG/ML injection 75 mL (75  mLs Intravenous Contrast Given 02/02/22 0850)  morphine (PF) 4 MG/ML injection 6 mg (6 mg Intravenous Given 02/02/22 0912)     IMPRESSION / MDM / ASSESSMENT AND PLAN / ED COURSE  I reviewed the triage vital signs and the nursing notes.                              Differential diagnosis includes, but is not limited to, ACS, PE, dissection, pneumonia, spontaneous pneumothorax, bronchitis, pericarditis, endocarditis and GI etiologies.  ECG is markable sinus tachycardia with ventricular rate of 116, normal axis, unremarkable intervals without evidence of acute ischemia or significant arrhythmia.  Given nonelevated troponin x2 have a low suspicion for ACS or myocarditis.  Chest x-ray reviewed by myself shows no focal consoidation, effusion, edema, pneumothorax or other clear acute thoracic process. I also reviewed radiology interpretation and agree with findings described.  CTA chest on my interpretation shows no large PE, evidence of dissection, pneumonia, pneumothorax, effusion or other clear acute thoracic process.  I also reviewed radiology interpretation and agree with their findings of no acute thoracic process.  CBC shows leukocytosis with WBC count of 14.6 without evidence of acute anemia.  BMP shows bicarb of 20 without any other significant lateral metabolic derangements.  Anion gap of 12.  COVID influenza PCR is negative.  Lactic acid is not elevated.  Coagulation studies are unremarkable.  Procalcitonin is undetectable.  Overall unclear etiology for patient's symptoms, I suspect possible pleurisy versus chest wall inflammation versus some muscle spasm in the chest that she describes her symptoms as fairly paroxysmal and tightness.  On reassessment she states she is feeling much better.  Discussed incidental findings of high blood pressure and recommendation and was followed up outpatient.  I considered observation and further diagnostic studies although given stable vitals but otherwise  reassuring exam work-up and low suspicion for other immediate life-threatening process setting patient is stable for discharge with outpatient follow-up.  Discharged in stable condition.  Strict return precautions advised and discussed.  Albuterol refill prescription sent as requested.   FINAL CLINICAL IMPRESSION(S) / ED DIAGNOSES   Final diagnoses:  Chest pain, unspecified type  Hypertension, unspecified type     Rx / DC Orders   ED Discharge Orders          Ordered    albuterol (VENTOLIN HFA) 108 (90 Base) MCG/ACT inhaler  Every 6 hours PRN        02/02/22 0927             Note:  This document was prepared  using Systems analyst and may include unintentional dictation errors.   Lucrezia Starch, MD 02/02/22 [id number]    Lucrezia Starch, MD 02/02/22 [phone]

## 2022-02-07 LAB — CULTURE, BLOOD (ROUTINE X 2)
Culture: NO GROWTH
Culture: NO GROWTH
Special Requests: ADEQUATE
Special Requests: ADEQUATE

## 2022-05-27 ENCOUNTER — Emergency Department
Admission: EM | Admit: 2022-05-27 | Discharge: 2022-05-27 | Disposition: A | Discharge status: Home / Self Care | Payer: Medicaid Other | Attending: Student in an Organized Health Care Education/Training Program | Admitting: Student in an Organized Health Care Education/Training Program

## 2022-05-27 ENCOUNTER — Emergency Department: Payer: Medicaid Other

## 2022-05-27 ENCOUNTER — Other Ambulatory Visit: Payer: Self-pay

## 2022-05-27 DIAGNOSIS — I160 Hypertensive urgency: Secondary | ICD-10-CM | POA: Insufficient documentation

## 2022-05-27 DIAGNOSIS — I10 Essential (primary) hypertension: Secondary | ICD-10-CM | POA: Insufficient documentation

## 2022-05-27 DIAGNOSIS — R519 Headache, unspecified: Secondary | ICD-10-CM | POA: Diagnosis present

## 2022-05-27 DIAGNOSIS — J45909 Unspecified asthma, uncomplicated: Secondary | ICD-10-CM | POA: Diagnosis not present

## 2022-05-27 DIAGNOSIS — R799 Abnormal finding of blood chemistry, unspecified: Secondary | ICD-10-CM | POA: Insufficient documentation

## 2022-05-27 LAB — BASIC METABOLIC PANEL
Anion gap: 8 (ref 5–15)
BUN: 12 mg/dL (ref 6–20)
CO2: 22 mmol/L (ref 22–32)
Calcium: 8.8 mg/dL — ABNORMAL LOW (ref 8.9–10.3)
Chloride: 107 mmol/L (ref 98–111)
Creatinine, Ser: 0.64 mg/dL (ref 0.44–1.00)
GFR, Estimated: >60 mL/min (ref >60)
Glucose, Bld: 120 mg/dL — ABNORMAL HIGH (ref 70–99)
Potassium: 4.1 mmol/L (ref 3.5–5.1)
Sodium: 137 mmol/L (ref 135–145)

## 2022-05-27 LAB — URINALYSIS, ROUTINE W REFLEX MICROSCOPIC
Bacteria, UA: NONE SEEN
Bilirubin Urine: NEGATIVE
Glucose, UA: NEGATIVE mg/dL
Ketones, ur: NEGATIVE mg/dL
Leukocytes,Ua: NEGATIVE
Nitrite: NEGATIVE
Protein, ur: NEGATIVE mg/dL
Specific Gravity, Urine: 1.021 (ref 1.005–1.030)
Squamous Epithelial / HPF: >50 — ABNORMAL HIGH (ref 0–5)
pH: 5 (ref 5.0–8.0)

## 2022-05-27 LAB — CBC
HCT: 45.3 % (ref 36.0–46.0)
Hemoglobin: 14.7 g/dL (ref 12.0–15.0)
MCH: 30.3 pg (ref 26.0–34.0)
MCHC: 32.5 g/dL (ref 30.0–36.0)
MCV: 93.4 fL (ref 80.0–100.0)
Platelets: 457 10*3/uL — ABNORMAL HIGH (ref 150–400)
RBC: 4.85 MIL/uL (ref 3.87–5.11)
RDW: 12.3 % (ref 11.5–15.5)
WBC: 12.7 10*3/uL — ABNORMAL HIGH (ref 4.0–10.5)
nRBC: 0 % (ref 0.0–0.2)

## 2022-05-27 LAB — CBG MONITORING, ED: Glucose-Capillary: 112 mg/dL — ABNORMAL HIGH (ref 70–99)

## 2022-05-27 LAB — POC URINE PREG, ED: Preg Test, Ur: NEGATIVE

## 2022-05-27 MED ORDER — METOCLOPRAMIDE HCL 5 MG/ML IJ SOLN
10.0000 mg | Freq: Once | INTRAMUSCULAR | Status: AC
  Administered 2022-05-27: 10 mg via INTRAVENOUS
  Filled 2022-05-27: qty 2

## 2022-05-27 MED ORDER — DIPHENHYDRAMINE HCL 50 MG/ML IJ SOLN
12.5000 mg | Freq: Once | INTRAMUSCULAR | Status: AC
  Administered 2022-05-27: 12.5 mg via INTRAVENOUS
  Filled 2022-05-27: qty 1

## 2022-05-27 MED ORDER — CLONIDINE HCL 0.1 MG PO TABS
0.2000 mg | ORAL_TABLET | Freq: Once | ORAL | Status: AC
  Administered 2022-05-27: 0.2 mg via ORAL
  Filled 2022-05-27: qty 2

## 2022-05-27 MED ORDER — FENTANYL CITRATE PF 50 MCG/ML IJ SOSY
50.0000 ug | PREFILLED_SYRINGE | Freq: Once | INTRAMUSCULAR | Status: AC
  Administered 2022-05-27: 50 ug via INTRAVENOUS
  Filled 2022-05-27: qty 1

## 2022-05-27 MED ORDER — HYDROCHLOROTHIAZIDE 25 MG PO TABS: 25.0000 mg | ORAL_TABLET | Freq: Every day | ORAL | 0 refills | Status: AC

## 2022-05-27 MED ORDER — LABETALOL HCL 5 MG/ML IV SOLN
10.0000 mg | Freq: Once | INTRAVENOUS | Status: AC
  Administered 2022-05-27: 10 mg via INTRAVENOUS
  Filled 2022-05-27: qty 4

## 2022-05-27 MED ORDER — BUTALBITAL-APAP-CAFFEINE 50-325-40 MG PO TABS
2.0000 | ORAL_TABLET | Freq: Once | ORAL | Status: AC
  Administered 2022-05-27: 2 via ORAL
  Filled 2022-05-27: qty 2

## 2022-05-27 MED ORDER — ALPRAZOLAM 0.5 MG PO TABS
1.0000 mg | ORAL_TABLET | Freq: Once | ORAL | Status: AC
  Administered 2022-05-27: 1 mg via ORAL
  Filled 2022-05-27: qty 2

## 2022-05-27 NOTE — ED Triage Notes (Signed)
Pt come with c/o elevated BP. Pt states this started few days ago. Pt states headache, some blurry vision. Pt denies any BP medications.  Pt tearful in triage

## 2022-05-27 NOTE — Discharge Instructions (Addendum)
Please call and schedule a follow up with primary care.  Return to the ER for symptoms of concern.

## 2022-05-27 NOTE — ED Provider Notes (Signed)
[facility] Provider Note    Event Date/Time   First MD Initiated Contact with Patient 05/27/22 1013     (approximate)   History   Hypertension   HPI  Cassandra Cortez is a 41 y.o. female with history of asthma presents to the ER for evaluation of hypertension. She has had a headache for the past week, but didn't realize it was due to her BP. She went to the dentist yesterday and her pressure was elevated. Since she found out how high it is, she has been anxious and had a headache and double vision. She was unable to sleep last night due to worrying about what might be wrong.   Past Medical History:  Diagnosis Date   Asthma      Physical Exam   Triage Vital Signs: ED Triage Vitals  Enc Vitals Group     BP 05/27/22 0939 (!) 156/129     Pulse Rate 05/27/22 0939 (!) 105     Resp 05/27/22 0939 18     Temp 05/27/22 0939 98.5 F (36.9 C)     Temp Source 05/27/22 0939 Oral     SpO2 05/27/22 0939 99 %     Weight --      Height --      Head Circumference --      Peak Flow --      Pain Score 05/27/22 0940 4     Pain Loc --      Pain Edu? --      Excl. in [place]? --     Most recent vital signs: Vitals:   05/27/22 1500 05/27/22 1515  BP: (!) 164/127   Pulse: 85 75  Resp: (!) 21 16  Temp:    SpO2: 98% 99%    General: Awake, no distress.  CV:  Good peripheral perfusion.  Resp:  Normal effort.  Abd:  No distention.  Other:  No weakness of extremities.    ED Results / Procedures / Treatments   Labs (all labs ordered are listed, but only abnormal results are displayed) Labs Reviewed  BASIC METABOLIC PANEL - Abnormal; Notable for the following components:      Result Value   Glucose, Bld 120 (*)    Calcium 8.8 (*)    All other components within normal limits  CBC - Abnormal; Notable for the following components:   WBC 12.7 (*)    Platelets 457 (*)    All other components within normal limits  URINALYSIS, ROUTINE W REFLEX MICROSCOPIC -  Abnormal; Notable for the following components:   Color, Urine YELLOW (*)    APPearance CLOUDY (*)    Hgb urine dipstick LARGE (*)    Squamous Epithelial / LPF >50 (*)    All other components within normal limits  CBG MONITORING, ED - Abnormal; Notable for the following components:   Glucose-Capillary 112 (*)    All other components within normal limits  POC URINE PREG, ED     EKG  Normal sinus rhythm with a rate of 94.  No ST elevation   RADIOLOGY  CT head negative for acute concerns.  Chest x-ray negative for acute cardiopulmonary abnormality.  I have independently reviewed and interpreted imaging as well as reviewed report from radiology.  PROCEDURES:  Critical Care performed: No  Procedures   MEDICATIONS ORDERED IN ED:  Medications  labetalol (NORMODYNE) injection 10 mg (10 mg Intravenous Given 05/27/22 1051)  ALPRAZolam (XANAX) tablet 1 mg (1 mg Oral  Given 05/27/22 1112)  labetalol (NORMODYNE) injection 10 mg (10 mg Intravenous Given 05/27/22 1143)  cloNIDine (CATAPRES) tablet 0.2 mg (0.2 mg Oral Given 05/27/22 1252)  butalbital-acetaminophen-caffeine (FIORICET) 50-325-40 MG per tablet 2 tablet (2 tablets Oral Given 05/27/22 1435)  diphenhydrAMINE (BENADRYL) injection 12.5 mg (12.5 mg Intravenous Given 05/27/22 1522)  metoCLOPramide (REGLAN) injection 10 mg (10 mg Intravenous Given 05/27/22 1521)  fentaNYL (SUBLIMAZE) injection 50 mcg (50 mcg Intravenous Given 05/27/22 1523)     IMPRESSION / MDM / ASSESSMENT AND PLAN / ED COURSE   I reviewed the triage vital signs and the nursing notes.  Differential diagnosis includes, but is not limited to: hypertensive urgency; migraine; anxiety  Patient's presentation is most consistent with acute presentation with potential threat to life or bodily function.  The patient is on the cardiac monitor to evaluate for evidence of arrhythmia and/or significant heart rate changes.  Clinical Course as of 05/27/22 1523  Tue May 27, 6537  1832 41 year old female presenting to the emergency department for treatment and evaluation of hypertension.  See HPI for further details.  10 mg labetalol ordered. [CT]  1115 Patient extremely anxious and remains hypertensive.  We will give her 1 mg of Xanax to help with the anxiety and therefore decrease blood pressure. [CT]  1150 Remains hypertensive.  Second dose of labetalol IV ordered. [CT]  1300 Anxiety less after Xanax. Blood pressure remains elevated. Clonidine 0.2mg  ordered. Heart rate stable in the 70s-80s. Patient still complaining of headache. Will get CT head. No neuro symptoms or findings of concern on exam. [CT]  1415 BP down to 143/83. Patient continues to complain of headache. Now saying it is frontal and behind her eyes. Will order Fioricet for what is likely a tension headache.  [CT]  1519 Considered admission for symptomatic hypertension, but with BP responding to medications, she will be safe for outpatient treatment. Case reviewed with ED attending. Will give headache cocktail and discharge home to follow up outpatient with PCP. Daughter at bedside. Plan for discharge discussed.  [CT]    Clinical Course User Index [CT] Leland Staszewski B, FNP     FINAL CLINICAL IMPRESSION(S) / ED DIAGNOSES   Final diagnoses:  Hypertensive urgency     Rx / DC Orders   ED Discharge Orders          Ordered    hydrochlorothiazide (HYDRODIURIL) 25 MG tablet  Daily        05/27/22 1517             Note:  This document was prepared using Dragon voice recognition software and may include unintentional dictation errors.   Chinita Pester, FNP 05/27/22 1523    Willy Eddy, MD 05/28/22 [phone]

## 2022-05-27 NOTE — ED Notes (Signed)
60 yof with a c/c of headache for the last week. The pt advised she went for a dental procedure yesterday but they wouldn't do it due to her blood pressure readings. The pt is also c/o some blurred vision, denies hx of htn.

## 2022-06-05 ENCOUNTER — Ambulatory Visit
Admission: EM | Admit: 2022-06-05 | Discharge: 2022-06-05 | Disposition: A | Discharge status: Home / Self Care | Payer: Medicaid Other | Attending: Student | Admitting: Student

## 2022-06-05 DIAGNOSIS — L02214 Cutaneous abscess of groin: Secondary | ICD-10-CM | POA: Diagnosis not present

## 2022-06-05 MED ORDER — DOXYCYCLINE HYCLATE 100 MG PO CAPS: 100.0000 mg | ORAL_CAPSULE | Freq: Two times a day (BID) | ORAL | 0 refills | Status: AC

## 2022-06-05 MED ORDER — OXYCODONE HCL 5 MG PO TABS: 5.0000 mg | ORAL_TABLET | ORAL | 0 refills | Status: AC | PRN

## 2022-06-05 NOTE — ED Triage Notes (Signed)
Patient presents UC for 2 cyst -- she states they feel like a golf ball -- started about a few days ago.   Patient started they are right at her panty line.   Has been taking baths in Epson salt -- not helping.

## 2022-06-05 NOTE — Discharge Instructions (Addendum)
-  Doxycycline twice daily for 7 days.  Make sure to wear sunscreen while spending time outside while on this medication as it can increase your chance of sunburn. You can take this medication with food if you have a sensitive stomach. -Warm bath daily  -Follow-up with ED or PCP if symptoms worsen/persist. I also recommend reestablishing care with dermatology.

## 2022-10-23 NOTE — Progress Notes (Signed)
 Cassandra Cortez is a 41 y.o. female here for new  patient visit to discuss:  History of Present Illness:   1. Left knee pain: 41 y.o. female, comes in today with concern of left knee pain.  Pain feels as though it is inside the knee.  Pain started while patient was walking down stairs holding laundry 3 days ago.  Does not recall having had the symptoms previously.  Patient feels as though the bones are rubbing together in the knee.  Can hear popping and crunching.  Reports giving way secondary to pain.  Reports anterior knee swelling.  No redness or bruising or increased warmth.  Has been taking Tylenol , naproxen  with little relief of symptoms.  No history of left knee surgery or injury.  Does have history of psoriasis - no documented history of psoriatic arthritis.  Patient reports that her mother had to have a knee replacement at the same age.  Patient reports that she has gained some weight recently since her father passed away, is wondering if this may be contributing to her symptoms.   The following portions of the patient's history were reviewed and updated as appropriate.   Past Medical History:   History reviewed. No pertinent past medical history.   Past Surgical History:   History reviewed. No pertinent surgical history.   Allergies:   Allergies  Allergen Reactions  . Penicillins Rash and Swelling    Has patient had a PCN reaction causing immediate rash, facial/tongue/throat swelling, SOB or lightheadedness with hypotension: Yes  Has patient had a PCN reaction causing severe rash involving mucus membranes or skin necrosis: No  Has patient had a PCN reaction that required hospitalization No  Has patient had a PCN reaction occurring within the last 10 years: No  If all of the above answers are NO, then may proceed with Cephalosporin use.  . Sulfasalazine Rash and Swelling  . Sulfa (Sulfonamide Antibiotics) Rash and Swelling     Medications:   Prior to Admission  medications   Medication Sig Taking? Last Dose  albuterol  90 mcg/actuation inhaler Inhale 2 inhalations into the lungs Yes Taking     Family History:   History reviewed. No pertinent family history.   Social History:   Social History   Socioeconomic History  . Marital status: Single  Tobacco Use  . Smoking status: Never  . Smokeless tobacco: Never  Vaping Use  . Vaping Use: Never used  Substance and Sexual Activity  . Alcohol use: Never  . Drug use: Defer  . Sexual activity: Defer     Review of Systems:   As per HPI   Vital Signs:   Vitals:   10/23/22 1003  BP: (!) 172/94  Pulse: 86  Temp: 36.9 C (98.5 F)  SpO2: 98%  Weight: (!) 113.4 kg (250 lb)  Height: 156.2 cm (5' 1.5)     Body mass index is 46.47 kg/m.   Physical Exam:   General:  Well appearing, pleasant, no acute distress,  LEFT KNEE: Mild anterior effusion without erythema or increased warmth or ecchymosis or skin break.  No medial or lateral joint line tenderness to palpation.  No popliteal mass or tenderness to palpation.  Pain with patellar compression.  Decreased range of motion with flexion and extension of the knee secondary to reported pain; mild crepitus noted with range of motion.  Negative anterior and posterior drawer.  Negative varus and valgus stress test.  Neurovascular intact distal to knee Neurologic:  Alert  Plain films left knee:   Narrowing of the patellofemoral compartment with minor spurring at the superior aspect of the patella.  Plain films reviewed by me prior to radiology read.   Assessment and Plan:   1. Acute pain of left knee  (primary encounter diagnosis): Acute.  Plain films as above with concern for narrowing of the patellofemoral compartment, concern for degenerative joint disease.  Prednisone  for inflammation.  OTC Tylenol  for pain.  Tramadol  for severe pain.  Advised regarding icing.  Hinged knee brace dispensed for comfort/support.  Consider referral to  orthopedics if not improving (patient to call).  Orders for this Visit:   Diagnoses and all orders for this visit:  Acute pain of left knee -     X-ray knee left 4 plus views -     [employer] Hinged Knee brace  Other orders -     predniSONE  (DELTASONE ) 10 MG tablet; 6 PO Q D X 1 DAY, THEN 5 PO Q D X 1 DAY, THEN 4 PO Q D X 1 DAY, THEN 3 PO Q D X 1 DAY, THEN 2 PO Q D X 1 DAY, THEN 1 PO Q D X 1 DAY -     traMADoL  (ULTRAM ) 50 mg tablet; Take 1 tablet (50 mg total) by mouth every 8 (eight) hours as needed for up to 5 days     Portions of this note were created using dictation software and may contain typographical errors.   Patient received an After Visit Summary

## 2024-04-30 ENCOUNTER — Emergency Department

## 2024-04-30 ENCOUNTER — Emergency Department
Admission: EM | Admit: 2024-04-30 | Discharge: 2024-04-30 | Disposition: A | Discharge status: Home / Self Care | Attending: Emergency Medicine | Admitting: Emergency Medicine

## 2024-04-30 ENCOUNTER — Encounter: Payer: Self-pay | Admitting: Emergency Medicine

## 2024-04-30 ENCOUNTER — Other Ambulatory Visit: Payer: Self-pay

## 2024-04-30 DIAGNOSIS — R7309 Other abnormal glucose: Secondary | ICD-10-CM | POA: Diagnosis not present

## 2024-04-30 DIAGNOSIS — R509 Fever, unspecified: Secondary | ICD-10-CM | POA: Insufficient documentation

## 2024-04-30 DIAGNOSIS — M79662 Pain in left lower leg: Secondary | ICD-10-CM | POA: Insufficient documentation

## 2024-04-30 DIAGNOSIS — J45909 Unspecified asthma, uncomplicated: Secondary | ICD-10-CM | POA: Insufficient documentation

## 2024-04-30 DIAGNOSIS — M79652 Pain in left thigh: Secondary | ICD-10-CM | POA: Diagnosis present

## 2024-04-30 DIAGNOSIS — I10 Essential (primary) hypertension: Secondary | ICD-10-CM | POA: Diagnosis not present

## 2024-04-30 DIAGNOSIS — M79605 Pain in left leg: Secondary | ICD-10-CM

## 2024-04-30 LAB — CBC
HCT: 40.4 % (ref 36.0–46.0)
Hemoglobin: 13.1 g/dL (ref 12.0–15.0)
MCH: 29.4 pg (ref 26.0–34.0)
MCHC: 32.4 g/dL (ref 30.0–36.0)
MCV: 90.6 fL (ref 80.0–100.0)
Platelets: 385 10*3/uL (ref 150–400)
RBC: 4.46 MIL/uL (ref 3.87–5.11)
RDW: 13.7 % (ref 11.5–15.5)
WBC: 9.7 10*3/uL (ref 4.0–10.5)
nRBC: 0 % (ref 0.0–0.2)

## 2024-04-30 LAB — COMPREHENSIVE METABOLIC PANEL WITH GFR
ALT: 23 U/L (ref 0–44)
AST: 30 U/L (ref 15–41)
Albumin: 4.2 g/dL (ref 3.5–5.0)
Alkaline Phosphatase: 28 U/L — ABNORMAL LOW (ref 38–126)
Anion gap: 10 (ref 5–15)
BUN: 15 mg/dL (ref 6–20)
CO2: 23 mmol/L (ref 22–32)
Calcium: 9 mg/dL (ref 8.9–10.3)
Chloride: 104 mmol/L (ref 98–111)
Creatinine, Ser: 0.79 mg/dL (ref 0.44–1.00)
GFR, Estimated: >60 mL/min (ref >60)
Glucose, Bld: 129 mg/dL — ABNORMAL HIGH (ref 70–99)
Potassium: 3.5 mmol/L (ref 3.5–5.1)
Sodium: 137 mmol/L (ref 135–145)
Total Bilirubin: 0.7 mg/dL (ref 0.0–1.2)
Total Protein: 7.9 g/dL (ref 6.5–8.1)

## 2024-04-30 LAB — URINALYSIS, ROUTINE W REFLEX MICROSCOPIC
Bilirubin Urine: NEGATIVE
Glucose, UA: NEGATIVE mg/dL
Ketones, ur: NEGATIVE mg/dL
Leukocytes,Ua: NEGATIVE
Nitrite: NEGATIVE
Protein, ur: 30 mg/dL — AB
Specific Gravity, Urine: 1.026 (ref 1.005–1.030)
pH: 5 (ref 5.0–8.0)

## 2024-04-30 LAB — CBG MONITORING, ED: Glucose-Capillary: 140 mg/dL — ABNORMAL HIGH (ref 70–99)

## 2024-04-30 LAB — POC URINE PREG, ED: Preg Test, Ur: NEGATIVE

## 2024-04-30 MED ORDER — HYDROXYZINE HCL 25 MG PO TABS: 25.0000 mg | ORAL_TABLET | Freq: Two times a day (BID) | ORAL | 0 refills | Status: AC | PRN

## 2024-04-30 MED ORDER — LORAZEPAM 1 MG PO TABS
1.0000 mg | ORAL_TABLET | Freq: Once | ORAL | Status: AC
  Administered 2024-04-30: 1 mg via ORAL
  Filled 2024-04-30: qty 1

## 2024-04-30 MED ORDER — CYCLOBENZAPRINE HCL 5 MG PO TABS: 5.0000 mg | ORAL_TABLET | Freq: Two times a day (BID) | ORAL | 0 refills | Status: AC | PRN

## 2024-04-30 NOTE — ED Triage Notes (Signed)
 Pt in with L thigh and L calf pain ongoing x few weeks, worse with movement. Pt states she has been running fevers at night and had poor appetite for about 3 wks. Denies any cough, sob or cp. Pain is localized to the L leg only

## 2024-04-30 NOTE — ED Provider Notes (Signed)
 [facility] Provider Note    Event Date/Time   First MD Initiated Contact with Patient 04/30/24 [phone]     (approximate)  History   Chief Complaint: Leg Pain  HPI  Cassandra Cortez is a 43 y.o. female with a past medical history of asthma who presents to the emergency department with multiple complaints.  Patient states over the last 4 weeks patient has been experiencing pain in her left thigh down into the left calf.  States at times she feels like she is running low-grade fevers.  States she has not been eating or drinking as much recently and thinks that her skin is more pale than typical.  Patient is concerned given the ongoing pain in the left leg she thought this was just muscular discomfort but it has not gone away so the patient came to the emergency department tonight for evaluation.  Physical Exam   Triage Vital Signs: ED Triage Vitals  Encounter Vitals Group     BP 04/30/24 0354 (!) 185/111     Systolic BP Percentile --      Diastolic BP Percentile --      Pulse Rate 04/30/24 0354 (!) 117     Resp 04/30/24 0354 20     Temp 04/30/24 0354 98.5 F (36.9 C)     Temp Source 04/30/24 0354 Oral     SpO2 04/30/24 0354 100 %     Weight 04/30/24 0354 265 lb (120.2 kg)     Height 04/30/24 0354 5\' 1"  (1.549 m)     Head Circumference --      Peak Flow --      Pain Score 04/30/24 0401 7     Pain Loc --      Pain Education --      Exclude from Growth Chart --     Most recent vital signs: Vitals:   04/30/24 0354  BP: (!) 185/111  Pulse: (!) 117  Resp: 20  Temp: 98.5 F (36.9 C)  SpO2: 100%    General: Awake, no distress.  CV:  Good peripheral perfusion.  Regular rate and rhythm  Resp:  Normal effort.  Equal breath sounds bilaterally.  Abd:  No distention.  Other:  No calf tenderness.  No leg swelling or appreciable erythema.   ED Results / Procedures / Treatments   EKG  EKG viewed and interpreted by myself shows sinus tachycardia at 107  bpm with a narrow QRS, normal axis, normal intervals, no concerning ST changes.  RADIOLOGY  Ultrasound negative for DVT   MEDICATIONS ORDERED IN ED: Medications  LORazepam (ATIVAN) tablet 1 mg (has no administration in time range)     IMPRESSION / MDM / ASSESSMENT AND PLAN / ED COURSE  I reviewed the triage vital signs and the nursing notes.  Patient's presentation is most consistent with acute presentation with potential threat to life or bodily function.  Patient presents to the emergency department for left leg pain.  Overall the patient appears well.  Her workup is reassuring with a normal CBC, normal chemistry.  No concerning findings on urinalysis.  Pregnancy test is negative.  Patient's physical exam is reassuring.  However given her left leg pain complaints ongoing times several weeks we will obtain an ultrasound to rule out DVT.  If the ultrasound is negative we will discharge on a muscle relaxer.  Patient noted to be quite hypertensive currently 185/111.  Patient denies any history of hypertension does not take any blood pressure  medication at baseline.  Patient is very anxious.  Will dose a small dose of Ativan while awaiting ultrasound results.  Patient's ultrasound is negative for DVT.  Patient is feeling better after medication.  I suspect anxiety is playing a significant role we will discharge with hydroxyzine to be used if needed.  Have the patient follow-up with her PCP as scheduled coming up soon.  FINAL CLINICAL IMPRESSION(S) / ED DIAGNOSES   Left leg pain   Note:  This document was prepared using Dragon voice recognition software and may include unintentional dictation errors.   Ruth Cove, MD 04/30/24 [phone]

## 2024-07-06 ENCOUNTER — Telehealth: Payer: Self-pay | Admitting: Pediatrics

## 2024-07-06 ENCOUNTER — Ambulatory Visit: Admitting: Pediatrics

## 2024-07-06 ENCOUNTER — Telehealth: Payer: Self-pay

## 2024-07-06 NOTE — Telephone Encounter (Signed)
 Copied from CRM [id number]. Topic: Appointments - Appointment Cancel/Reschedule >> Jul 06, 2024  8:09 AM Charlet HERO wrote: Patient/patient representative is calling to reschedule an appointment. Refer to attachments for appointment information.  The patient called to reschedule this a hospital followup will need to be sooner than the date of 09/18

## 2024-07-06 NOTE — Telephone Encounter (Signed)
 Called patient and left a message for her to call back to get scheduled for an appt sooner. She will need to be transferred to office for scheduling.

## 2024-07-06 NOTE — Telephone Encounter (Signed)
 New patient needing a hospital follow up sooner than her scheduled 9/18.

## 2024-07-08 NOTE — Telephone Encounter (Signed)
Scheduled for 8/14

## 2024-07-28 ENCOUNTER — Ambulatory Visit: Admitting: Pediatrics

## 2024-07-28 ENCOUNTER — Ambulatory Visit: Payer: Self-pay

## 2024-07-28 NOTE — Telephone Encounter (Signed)
 went to er for right arm pain and pulse. Blood pressure was extremely high. She can't make the appt today needs to be rescheduled asap. I didn't see anything available to schedule right away. Please leave a message if pt doesn't answer    Nurse attempted to reach patient but no answer

## 2024-09-02 ENCOUNTER — Ambulatory Visit: Admitting: Pediatrics
